# Patient Record
Sex: Male | Born: 1980 | Race: Black or African American | Hispanic: No | Marital: Single | Smoking: Current every day smoker
Health system: Southern US, Community
[De-identification: ages and names within clinical notes are randomized; demographics above are authoritative.]

## PROBLEM LIST (undated history)

## (undated) DIAGNOSIS — T790XXA Air embolism (traumatic), initial encounter: Secondary | ICD-10-CM

## (undated) HISTORY — PX: IVC FILTER PLACEMENT (ARMC HX): HXRAD1551

## (undated) HISTORY — PX: HIP SURGERY: SHX245

---

## 2006-04-20 ENCOUNTER — Emergency Department: Payer: Self-pay

## 2007-01-28 ENCOUNTER — Emergency Department (HOSPITAL_COMMUNITY): Admission: EM | Admit: 2007-01-28 | Discharge: 2007-01-28 | Payer: Self-pay | Admitting: Emergency Medicine

## 2009-11-14 ENCOUNTER — Emergency Department: Payer: Self-pay | Admitting: Emergency Medicine

## 2009-12-09 ENCOUNTER — Ambulatory Visit: Payer: Self-pay | Admitting: Cardiovascular Disease

## 2009-12-09 ENCOUNTER — Inpatient Hospital Stay: Payer: Self-pay | Admitting: Internal Medicine

## 2010-01-23 ENCOUNTER — Encounter: Payer: Self-pay | Admitting: Orthopedic Surgery

## 2010-02-15 ENCOUNTER — Encounter: Payer: Self-pay | Admitting: Orthopedic Surgery

## 2010-03-03 ENCOUNTER — Ambulatory Visit: Payer: Self-pay

## 2011-01-18 ENCOUNTER — Emergency Department: Payer: Self-pay | Admitting: Emergency Medicine

## 2011-05-01 ENCOUNTER — Emergency Department: Payer: Self-pay | Admitting: Emergency Medicine

## 2011-11-17 ENCOUNTER — Emergency Department: Payer: Self-pay | Admitting: Emergency Medicine

## 2011-11-19 ENCOUNTER — Emergency Department: Payer: Self-pay | Admitting: Emergency Medicine

## 2011-11-22 ENCOUNTER — Emergency Department: Payer: Self-pay | Admitting: Emergency Medicine

## 2011-11-22 LAB — CSF CELL COUNT WITH DIFFERENTIAL
CSF Tube #: 1
Eosinophil: 0 %
Lymphocytes: 0 %
Lymphocytes: 100 %
Monocytes/Macrophages: 0 %
Neutrophils: 0 %
Neutrophils: 0 %
Other Cells: 0 %
RBC (CSF): 29 /mm3
WBC (CSF): 0 /mm3
WBC (CSF): 3 /mm3

## 2011-11-22 LAB — CBC WITH DIFFERENTIAL/PLATELET
Basophil #: 0 10*3/uL (ref 0.0–0.1)
Basophil %: 0.2 %
Eosinophil #: 0.1 10*3/uL (ref 0.0–0.7)
HCT: 44.3 % (ref 40.0–52.0)
HGB: 15.7 g/dL (ref 13.0–18.0)
Lymphocyte #: 1.1 10*3/uL (ref 1.0–3.6)
Lymphocyte %: 5.8 %
MCH: 31.9 pg (ref 26.0–34.0)
MCHC: 35.4 g/dL (ref 32.0–36.0)
MCV: 90 fL (ref 80–100)
Monocyte #: 1.1 x10 3/mm — ABNORMAL HIGH (ref 0.2–1.0)
Neutrophil #: 16.2 10*3/uL — ABNORMAL HIGH (ref 1.4–6.5)
RDW: 12.9 % (ref 11.5–14.5)

## 2011-11-22 LAB — URINALYSIS, COMPLETE
Bacteria: NONE SEEN
Bilirubin,UR: NEGATIVE
Blood: NEGATIVE
Glucose,UR: NEGATIVE mg/dL (ref 0–75)
Ketone: NEGATIVE
Leukocyte Esterase: NEGATIVE
Nitrite: NEGATIVE
Ph: 7 (ref 4.5–8.0)
RBC,UR: 3 /HPF (ref 0–5)
Specific Gravity: 1.026 (ref 1.003–1.030)
Squamous Epithelial: <1
WBC UR: 3 /HPF (ref 0–5)

## 2011-11-22 LAB — BASIC METABOLIC PANEL
Calcium, Total: 9 mg/dL (ref 8.5–10.1)
Chloride: 105 mmol/L (ref 98–107)
Co2: 30 mmol/L (ref 21–32)
EGFR (Non-African Amer.): >60
Glucose: 96 mg/dL (ref 65–99)
Osmolality: 281 (ref 275–301)
Potassium: 4.5 mmol/L (ref 3.5–5.1)
Sodium: 141 mmol/L (ref 136–145)

## 2011-11-22 LAB — PROTEIN, CSF: Protein, CSF: 32 mg/dL (ref 15–45)

## 2011-11-22 LAB — GLUCOSE, CSF: Glucose, CSF: 63 mg/dL (ref 40–75)

## 2011-11-30 ENCOUNTER — Emergency Department: Payer: Self-pay | Admitting: Emergency Medicine

## 2011-11-30 LAB — CBC
HGB: 16.3 g/dL (ref 13.0–18.0)
Platelet: 225 10*3/uL (ref 150–440)
RBC: 5.22 10*6/uL (ref 4.40–5.90)
WBC: 9.2 10*3/uL (ref 3.8–10.6)

## 2011-11-30 LAB — COMPREHENSIVE METABOLIC PANEL
Albumin: 4.4 g/dL (ref 3.4–5.0)
BUN: 9 mg/dL (ref 7–18)
Co2: 28 mmol/L (ref 21–32)
Glucose: 107 mg/dL — ABNORMAL HIGH (ref 65–99)
SGOT(AST): 12 U/L — ABNORMAL LOW (ref 15–37)
SGPT (ALT): 21 U/L (ref 12–78)
Total Protein: 8 g/dL (ref 6.4–8.2)

## 2012-07-25 ENCOUNTER — Encounter: Payer: Self-pay | Admitting: Cardiothoracic Surgery

## 2012-07-25 ENCOUNTER — Encounter: Payer: Self-pay | Admitting: Nurse Practitioner

## 2012-08-23 ENCOUNTER — Emergency Department: Payer: Self-pay | Admitting: Emergency Medicine

## 2012-08-23 LAB — COMPREHENSIVE METABOLIC PANEL
Albumin: 4 g/dL (ref 3.4–5.0)
Alkaline Phosphatase: 81 U/L (ref 50–136)
Anion Gap: 5 — ABNORMAL LOW (ref 7–16)
Bilirubin,Total: 0.6 mg/dL (ref 0.2–1.0)
Calcium, Total: 9 mg/dL (ref 8.5–10.1)
Chloride: 109 mmol/L — ABNORMAL HIGH (ref 98–107)
Co2: 27 mmol/L (ref 21–32)
EGFR (African American): >60
EGFR (Non-African Amer.): >60
Potassium: 3.8 mmol/L (ref 3.5–5.1)
SGOT(AST): 22 U/L (ref 15–37)
SGPT (ALT): 18 U/L (ref 12–78)

## 2012-08-23 LAB — CBC
HGB: 15.1 g/dL (ref 13.0–18.0)
MCH: 30.9 pg (ref 26.0–34.0)
MCV: 90 fL (ref 80–100)
RBC: 4.89 10*6/uL (ref 4.40–5.90)
RDW: 13.1 % (ref 11.5–14.5)

## 2012-12-01 IMAGING — CT CT HEAD WITHOUT CONTRAST
2 series · 15 of 30 positions shown, 19 images · non-contrast
Comparison: none

REASON FOR EXAM: headache
COMMENTS:

[Series 2: without · axial · non-contrast · 0.42mm/px · z∈[+500,+620]mm · 13 of 30 slices shown, 17 images]
[im 3/30  brain]
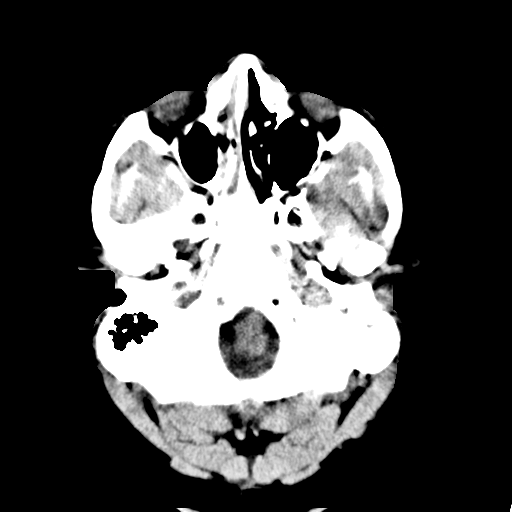
[im 3/30  bone]
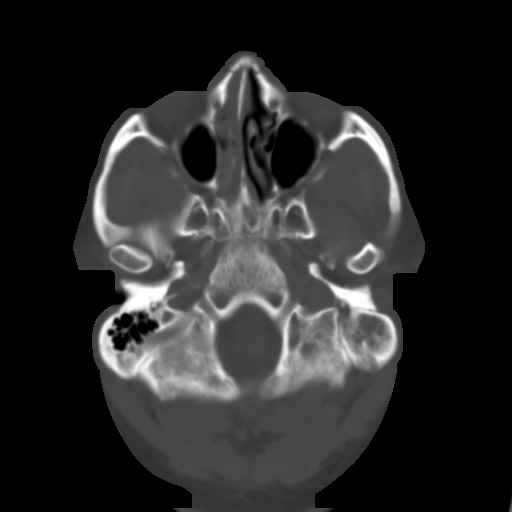
[im 5/30  brain]
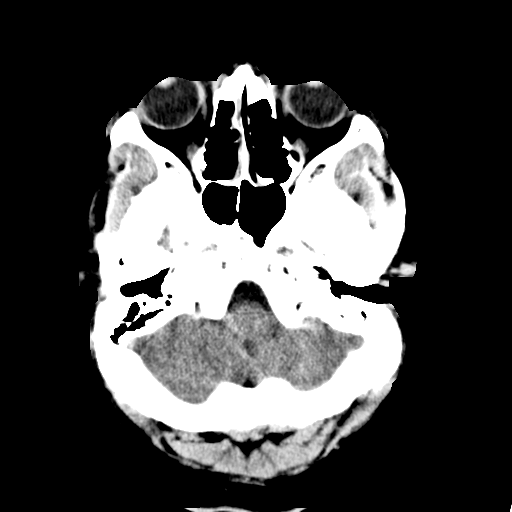
[im 7/30  brain]
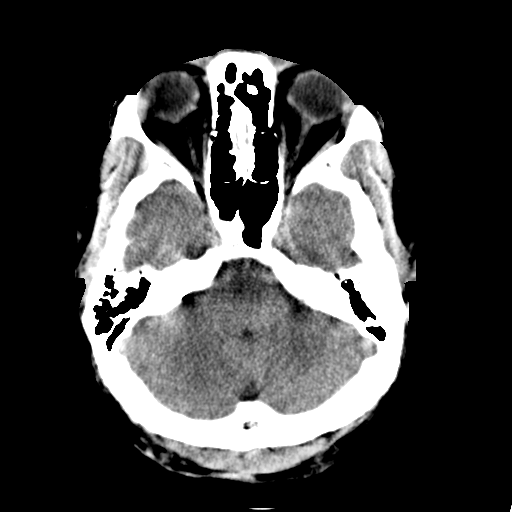
[im 9/30  brain]
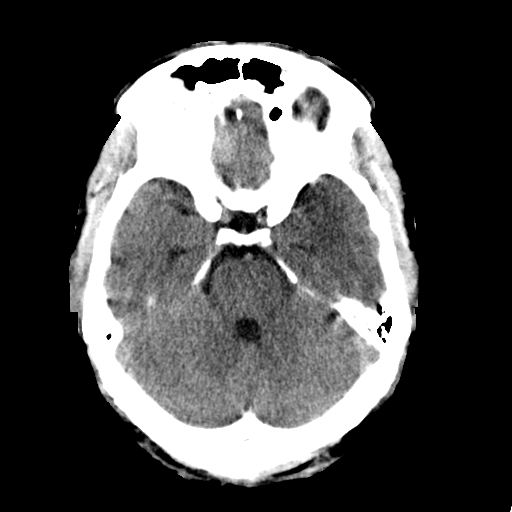
[im 11/30  brain]
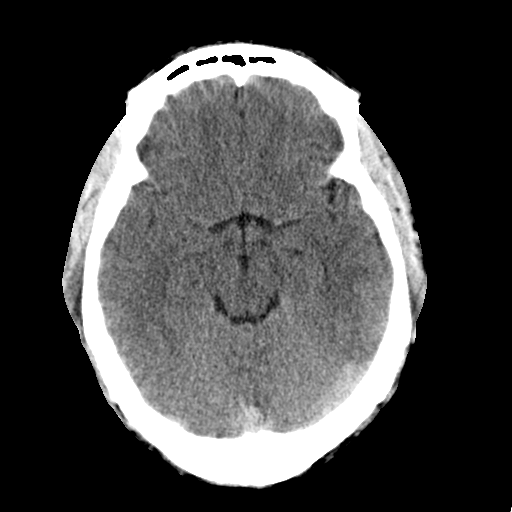
[im 11/30  bone]
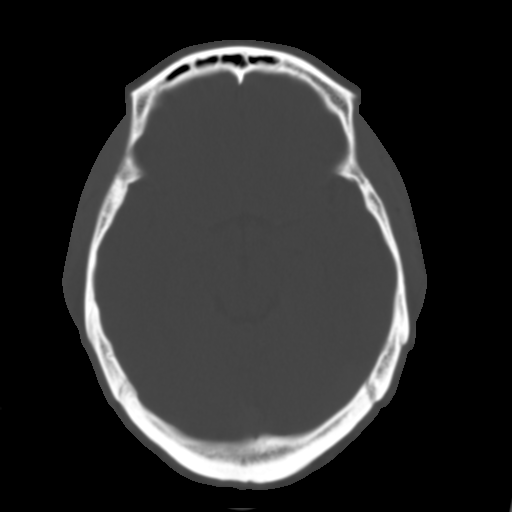
[im 13/30  brain]
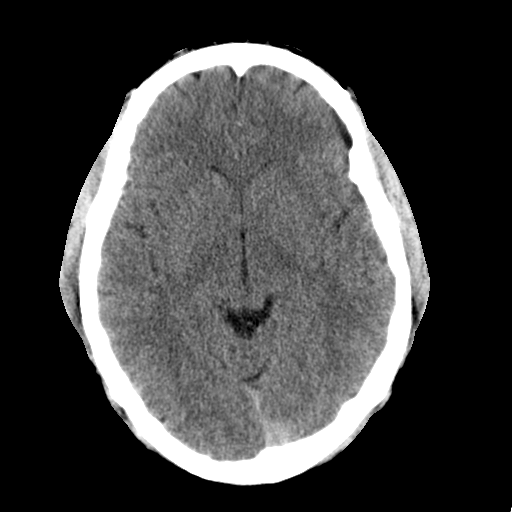
[im 15/30  brain]
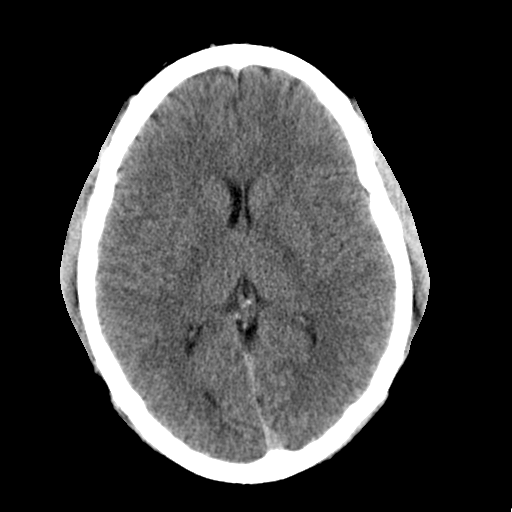
[im 17/30  brain]
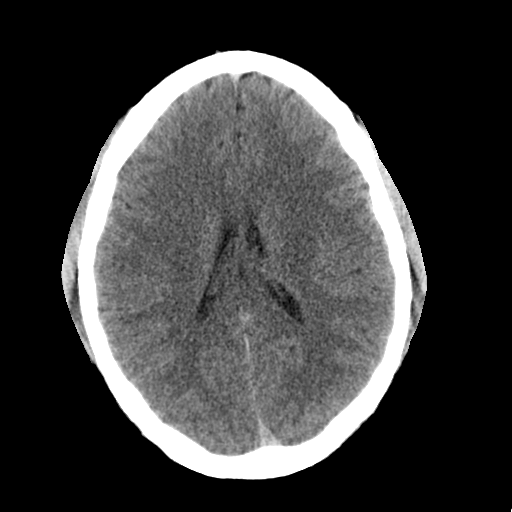
[im 19/30  brain]
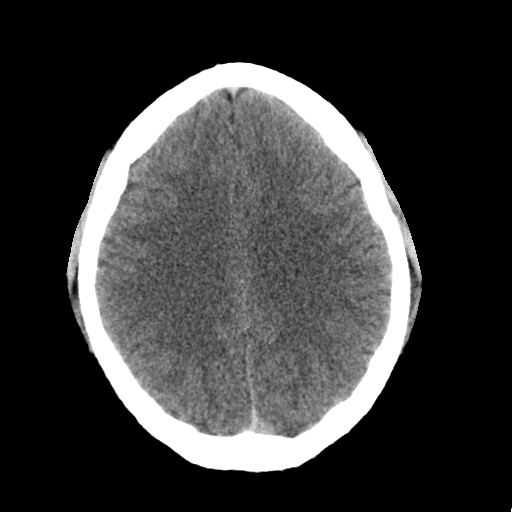
[im 19/30  bone]
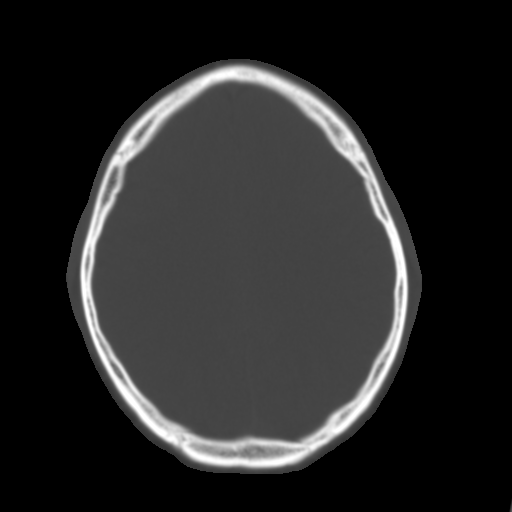
[im 21/30  brain]
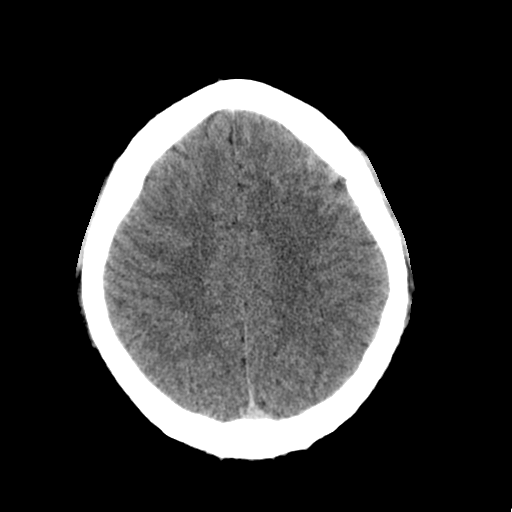
[im 23/30  brain]
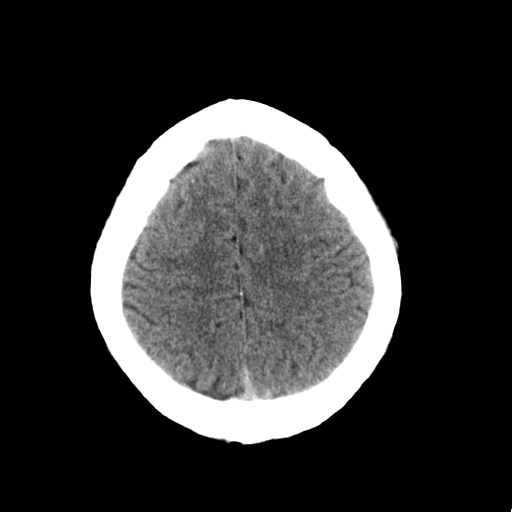
[im 25/30  brain]
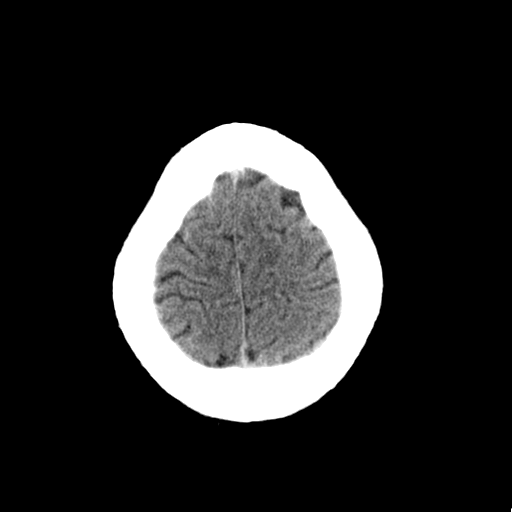
[im 27/30  brain]
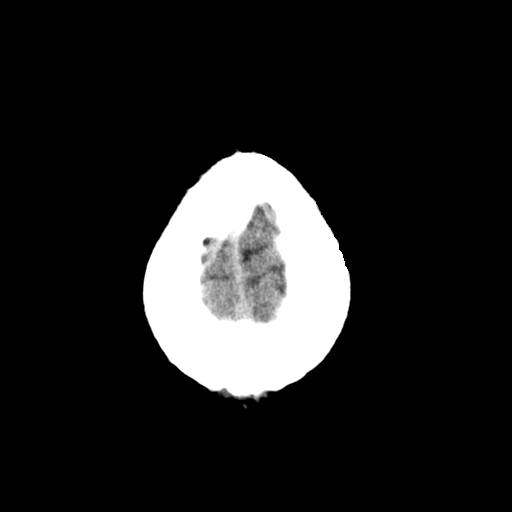
[im 27/30  bone]
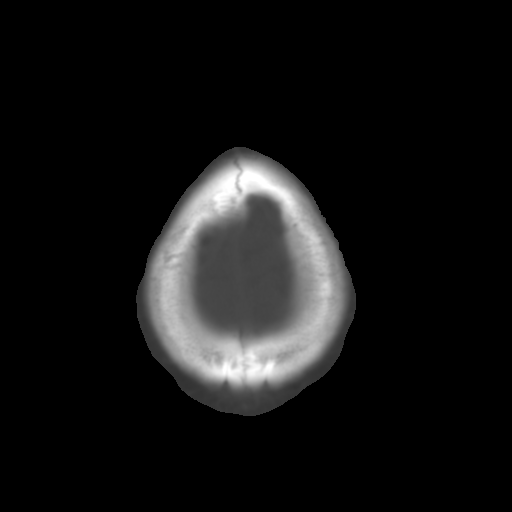

[Series 3: bone · axial · 0.42mm/px · z∈[+500,+520]mm · 2 of 30 slices shown]
[im 3/30  bone]
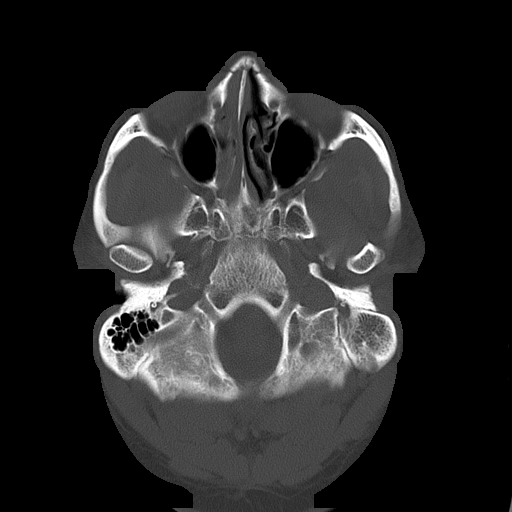
[im 7/30  bone]
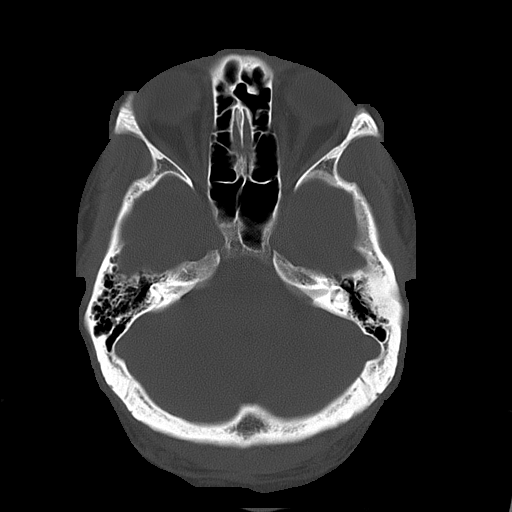

[15 of 30 positions shown; findings below may reference images not displayed]

PROCEDURE:     CT  - CT HEAD WITHOUT CONTRAST  - November 17, 2011  [DATE]

RESULT:     Axial noncontrast CT scanning was performed through the brain
with reconstructions at 5 mm intervals and slice thicknesses.

The ventricles are normal in size and position. There is no intracranial
hemorrhage nor intracranial mass effect. There is no evidence of an evolving
ischemic infarction. There are no abnormal intracranial calcifications. The
cerebellum and brainstem are normal in density.

At bone window settings the observed portions of the paranasal sinuses are
clear. The observed portions of the orbital soft tissues appear normal.
IMPRESSION: 1. There is no evidence of an acute ischemic or hemorrhagic infarction.
2. There is no intracranial mass effect nor hydrocephalus.
3. No acute abnormality of the of the observed portions of the paranasal
sinuses is seen.
4. The observed portions of the right globe and orbital soft tissues appear
normal.

[REDACTED]

## 2013-08-29 ENCOUNTER — Emergency Department: Payer: Self-pay | Admitting: Internal Medicine

## 2013-08-31 ENCOUNTER — Emergency Department: Payer: Self-pay | Admitting: Emergency Medicine

## 2013-08-31 LAB — CBC WITH DIFFERENTIAL/PLATELET
Basophil #: 0.1 10*3/uL (ref 0.0–0.1)
Basophil %: 0.5 %
EOS ABS: 0.1 10*3/uL (ref 0.0–0.7)
EOS PCT: 0.5 %
HCT: 47.2 % (ref 40.0–52.0)
HGB: 15.8 g/dL (ref 13.0–18.0)
Lymphocyte #: 1.9 10*3/uL (ref 1.0–3.6)
Lymphocyte %: 11.7 %
MCH: 30.6 pg (ref 26.0–34.0)
MCHC: 33.4 g/dL (ref 32.0–36.0)
MCV: 92 fL (ref 80–100)
MONOS PCT: 7.5 %
Monocyte #: 1.2 x10 3/mm — ABNORMAL HIGH (ref 0.2–1.0)
Neutrophil #: 12.8 10*3/uL — ABNORMAL HIGH (ref 1.4–6.5)
Neutrophil %: 79.8 %
Platelet: 213 10*3/uL (ref 150–440)
RBC: 5.15 10*6/uL (ref 4.40–5.90)
RDW: 12.9 % (ref 11.5–14.5)
WBC: 16 10*3/uL — ABNORMAL HIGH (ref 3.8–10.6)

## 2013-08-31 LAB — COMPREHENSIVE METABOLIC PANEL
ALBUMIN: 4.3 g/dL (ref 3.4–5.0)
ALK PHOS: 73 U/L
ANION GAP: 3 — AB (ref 7–16)
BUN: 10 mg/dL (ref 7–18)
Bilirubin,Total: 0.8 mg/dL (ref 0.2–1.0)
CHLORIDE: 109 mmol/L — AB (ref 98–107)
CO2: 27 mmol/L (ref 21–32)
Calcium, Total: 8.9 mg/dL (ref 8.5–10.1)
Creatinine: 1.19 mg/dL (ref 0.60–1.30)
EGFR (African American): >60
GLUCOSE: 101 mg/dL — AB (ref 65–99)
Osmolality: 277 (ref 275–301)
POTASSIUM: 3.8 mmol/L (ref 3.5–5.1)
SGOT(AST): 28 U/L (ref 15–37)
SGPT (ALT): 21 U/L (ref 12–78)
SODIUM: 139 mmol/L (ref 136–145)
TOTAL PROTEIN: 8.5 g/dL — AB (ref 6.4–8.2)

## 2013-08-31 LAB — LIPASE, BLOOD: Lipase: 155 U/L (ref 73–393)

## 2013-08-31 LAB — HEMOGLOBIN: HGB: 15.3 g/dL (ref 13.0–18.0)

## 2013-09-03 ENCOUNTER — Emergency Department: Payer: Self-pay | Admitting: Emergency Medicine

## 2013-09-07 ENCOUNTER — Emergency Department: Payer: Self-pay | Admitting: Emergency Medicine

## 2013-12-24 ENCOUNTER — Emergency Department: Payer: Self-pay | Admitting: Emergency Medicine

## 2015-01-09 ENCOUNTER — Emergency Department: Payer: Medicaid Other

## 2015-01-09 ENCOUNTER — Encounter: Payer: Self-pay | Admitting: Emergency Medicine

## 2015-01-09 ENCOUNTER — Emergency Department
Admission: EM | Admit: 2015-01-09 | Discharge: 2015-01-09 | Disposition: A | Discharge status: Home / Self Care | Payer: Medicaid Other | Attending: Student | Admitting: Student

## 2015-01-09 DIAGNOSIS — F329 Major depressive disorder, single episode, unspecified: Secondary | ICD-10-CM | POA: Diagnosis not present

## 2015-01-09 DIAGNOSIS — R000 Tachycardia, unspecified: Secondary | ICD-10-CM | POA: Insufficient documentation

## 2015-01-09 DIAGNOSIS — F1410 Cocaine abuse, uncomplicated: Secondary | ICD-10-CM | POA: Diagnosis present

## 2015-01-09 DIAGNOSIS — F1210 Cannabis abuse, uncomplicated: Secondary | ICD-10-CM | POA: Insufficient documentation

## 2015-01-09 DIAGNOSIS — F1110 Opioid abuse, uncomplicated: Secondary | ICD-10-CM | POA: Insufficient documentation

## 2015-01-09 DIAGNOSIS — F17200 Nicotine dependence, unspecified, uncomplicated: Secondary | ICD-10-CM | POA: Insufficient documentation

## 2015-01-09 HISTORY — DX: Air embolism (traumatic), initial encounter: T79.0XXA

## 2015-01-09 LAB — COMPREHENSIVE METABOLIC PANEL
ALBUMIN: 5.1 g/dL — AB (ref 3.5–5.0)
ALK PHOS: 56 U/L (ref 38–126)
ALT: 17 U/L (ref 17–63)
ANION GAP: 7 (ref 5–15)
AST: 19 U/L (ref 15–41)
BILIRUBIN TOTAL: 1.1 mg/dL (ref 0.3–1.2)
BUN: 11 mg/dL (ref 6–20)
CALCIUM: 9.3 mg/dL (ref 8.9–10.3)
CO2: 28 mmol/L (ref 22–32)
Chloride: 105 mmol/L (ref 101–111)
Creatinine, Ser: 1.15 mg/dL (ref 0.61–1.24)
GFR calc Af Amer: >60 mL/min (ref >60)
GFR calc non Af Amer: >60 mL/min (ref >60)
GLUCOSE: 101 mg/dL — AB (ref 65–99)
Potassium: 3.6 mmol/L (ref 3.5–5.1)
Sodium: 140 mmol/L (ref 135–145)
TOTAL PROTEIN: 8.6 g/dL — AB (ref 6.5–8.1)

## 2015-01-09 LAB — URINE DRUG SCREEN, QUALITATIVE (ARMC ONLY)
Amphetamines, Ur Screen: NOT DETECTED
BARBITURATES, UR SCREEN: NOT DETECTED
BENZODIAZEPINE, UR SCRN: NOT DETECTED
CANNABINOID 50 NG, UR ~~LOC~~: POSITIVE — AB
COCAINE METABOLITE, UR ~~LOC~~: POSITIVE — AB
MDMA (Ecstasy)Ur Screen: NOT DETECTED
METHADONE SCREEN, URINE: NOT DETECTED
OPIATE, UR SCREEN: POSITIVE — AB
Phencyclidine (PCP) Ur S: NOT DETECTED
Tricyclic, Ur Screen: NOT DETECTED

## 2015-01-09 LAB — CBC WITH DIFFERENTIAL/PLATELET
BASOS PCT: 1 %
Basophils Absolute: 0.1 10*3/uL (ref 0–0.1)
EOS ABS: 0.1 10*3/uL (ref 0–0.7)
Eosinophils Relative: 1 %
HCT: 49.5 % (ref 40.0–52.0)
HEMOGLOBIN: 16.5 g/dL (ref 13.0–18.0)
Lymphocytes Relative: 14 %
Lymphs Abs: 1.5 10*3/uL (ref 1.0–3.6)
MCH: 30 pg (ref 26.0–34.0)
MCHC: 33.4 g/dL (ref 32.0–36.0)
MCV: 90 fL (ref 80.0–100.0)
MONO ABS: 0.7 10*3/uL (ref 0.2–1.0)
MONOS PCT: 7 %
NEUTROS PCT: 77 %
Neutro Abs: 8.2 10*3/uL — ABNORMAL HIGH (ref 1.4–6.5)
PLATELETS: 268 10*3/uL (ref 150–440)
RBC: 5.5 MIL/uL (ref 4.40–5.90)
RDW: 12.8 % (ref 11.5–14.5)
WBC: 10.6 10*3/uL (ref 3.8–10.6)

## 2015-01-09 LAB — ETHANOL: Alcohol, Ethyl (B): <5 mg/dL (ref <5)

## 2015-01-09 LAB — ACETAMINOPHEN LEVEL

## 2015-01-09 LAB — SALICYLATE LEVEL

## 2015-01-09 MED ORDER — LORAZEPAM 2 MG/ML IJ SOLN
2.0000 mg | Freq: Once | INTRAMUSCULAR | Status: AC
  Administered 2015-01-09: 2 mg via INTRAVENOUS
  Filled 2015-01-09: qty 1

## 2015-01-09 MED ORDER — SODIUM CHLORIDE 0.9 % IV BOLUS (SEPSIS)
1000.0000 mL | Freq: Once | INTRAVENOUS | Status: AC
  Administered 2015-01-09: 1000 mL via INTRAVENOUS

## 2015-01-09 MED ORDER — LORAZEPAM 2 MG/ML IJ SOLN
INTRAMUSCULAR | Status: AC
  Administered 2015-01-09: 1 mg via INTRAVENOUS
  Filled 2015-01-09: qty 1

## 2015-01-09 MED ORDER — LORAZEPAM 2 MG/ML IJ SOLN
1.0000 mg | Freq: Once | INTRAMUSCULAR | Status: AC
  Administered 2015-01-09: 1 mg via INTRAVENOUS

## 2015-01-09 NOTE — ED Notes (Signed)
Pt discharged to [facility] after verbalizing understanding of discharge instructions; nad noted.

## 2015-01-09 NOTE — BH Assessment (Signed)
Per Request of ER MD (Dr. Inocencio HomesGayle), patient information forwarded to [facility] ([phone]). Obtained verbal consent from patient to send the information to [facility]. They will be able to pick the patient up at 2:15pm. Writer updated ER MD (Dr. Inocencio HomesGayle) and patient's RN Corrie Dandy(Mary).

## 2015-01-09 NOTE — ED Notes (Signed)
Pt presents after taking 1/2 gram cocaine this morning. He is tearful and sad, stating that he doesn't feel like anyone cares about him and that he "thought I would be farther along in life than I am." Pt wants to go to [facility] for detox and needs clearance. Pt tachycardiac and hypertensive at assessment.

## 2015-01-09 NOTE — ED Provider Notes (Signed)
[facility] Emergency Department Provider Note  ____________________________________________  Time seen: Approximately 10:24 AM  I have reviewed the triage vital signs and the nursing notes.   HISTORY  Chief Complaint detox     HPI Joe Ramirez is a 34 y.o. male with history of pulmonary embolism status post IVC filter, history of polysubstance abuse who presents for evaluation due to desire for detox off of cocaine. Patient reports that he has been abusing cocaine for several years, last used a gram of cocaine this morning. He also smokes marijuana. His addiction has been ongoing and he has not been able to help himself. He found a phone number, called [facility] and they told him to go to the emergency department for evaluation. He denies any suicidal ideation, homicidal ideation or audiovisual hallucinations. No nausea, vomiting, diarrhea, fevers or chills. No chest pain or difficulty breathing. He has had cough sneezing and runny nose but no fevers. He has no symptoms at this time but states he feels "sad". He has been mildly depressed, there are no modifying factors.   Past Medical History  Diagnosis Date  . Pulmonary air embolism (HCC)     There are no active problems to display for this patient.   History reviewed. No pertinent past surgical history.  No current outpatient prescriptions on file.  Allergies Review of patient's allergies indicates no known allergies.  No family history on file.  Social History Social History  Substance Use Topics  . Smoking status: Current Every Day Smoker  . Smokeless tobacco: None  . Alcohol Use: No    Review of Systems Constitutional: No fever/chills Eyes: No visual changes. ENT: No sore throat. Cardiovascular: Denies chest pain. Respiratory: Denies shortness of breath. Gastrointestinal: No abdominal pain.  No nausea, no vomiting.  No diarrhea.  No constipation. Genitourinary: Negative for  dysuria. Musculoskeletal: Negative for back pain. Skin: Negative for rash. Neurological: Negative for headaches, focal weakness or numbness.  10-point ROS otherwise negative.  ____________________________________________   PHYSICAL EXAM:  VITAL SIGNS: ED Triage Vitals  Enc Vitals Group     BP 01/09/15 1021 171/90 mmHg     Pulse Rate 01/09/15 1021 122     Resp 01/09/15 1021 18     Temp 01/09/15 1021 98.8 F (37.1 C)     Temp Source 01/09/15 1021 Oral     SpO2 01/09/15 1021 98 %     Weight --      Height --      Head Cir --      Peak Flow --      Pain Score --      Pain Loc --      Pain Edu? --      Excl. in GC? --     Constitutional: Alert and oriented. Well appearing and in no acute distress. Tearful at times. Eyes: Conjunctivae are normal. PERRL. EOMI. Head: Atraumatic. Nose: No congestion/rhinnorhea. Mouth/Throat: Mucous membranes are moist.  Oropharynx non-erythematous. Neck: No stridor. Cardiovascular: mildly tachycardic rate, regular rhythm. Grossly normal heart sounds.  Good peripheral circulation. Respiratory: Normal respiratory effort.  No retractions. Lungs CTAB. Gastrointestinal: Soft and nontender. No distention.  No CVA tenderness. Genitourinary: deferred Musculoskeletal: No lower extremity tenderness nor edema.  No joint effusions. Neurologic:  Normal speech and language. No gross focal neurologic deficits are appreciated. No gait instability. Skin:  Skin is warm, dry and intact. No rash noted. Psychiatric: Mood is slightly depressed but affect is normal. Speech and behavior are  normal.  ____________________________________________   LABS (all labs ordered are listed, but only abnormal results are displayed)  Labs Reviewed  COMPREHENSIVE METABOLIC PANEL - Abnormal; Notable for the following:    Glucose, Bld 101 (*)    Total Protein 8.6 (*)    Albumin 5.1 (*)    All other components within normal limits  CBC WITH DIFFERENTIAL/PLATELET - Abnormal;  Notable for the following:    Neutro Abs 8.2 (*)    All other components within normal limits  ACETAMINOPHEN LEVEL - Abnormal; Notable for the following:    Acetaminophen (Tylenol), Serum <10 (*)    All other components within normal limits  URINE DRUG SCREEN, QUALITATIVE ([facility] ONLY) - Abnormal; Notable for the following:    Cocaine Metabolite,Ur [place] POSITIVE (*)    Opiate, Ur Screen POSITIVE (*)    Cannabinoid 50 Ng, Ur [place] POSITIVE (*)    All other components within normal limits  ETHANOL  SALICYLATE LEVEL   ____________________________________________  EKG  ED ECG REPORT I, Gayla DossGayle, Khali Albanese A, the attending physician, personally viewed and interpreted this ECG.   Date: 01/09/2015  EKG Time: 11:31  Rate: 110  Rhythm: sinus tachycardia  Axis: normal  Intervals:none  ST&T Change: No acute ST elevation. Nonspecific T-wave abnormality in to 3, aVF, V4, V5, V6 which is unchanged from 11/22/11.  ____________________________________________  RADIOLOGY  CXR IMPRESSION: Stable chest x-ray. No evidence of acute cardiopulmonary abnormality. No evidence of pneumonia.  ____________________________________________   PROCEDURES  Procedure(s) performed: None  Critical Care performed: No  ____________________________________________   INITIAL IMPRESSION / ASSESSMENT AND PLAN / ED COURSE  Pertinent labs & imaging results that were available during my care of the patient were reviewed by me and considered in my medical decision making (see chart for details).  Joe Ramirez is a 34 y.o. male with history of pulmonary embolism status post IVC filter, history of polysubstance abuse who presents for evaluation due to desire for detox off of cocaine. On exam, he is very well-appearing and in no acute distress. He is mildly tachycardic and moderately hypertensive and I suspect this is secondary to his recent cocaine use which occurred just a few hours ago. Additionally, he is tearful and  somewhat emotional at times which is likely contributing to his tachycardia. We'll give IV fluids and Ativan. He denies any chest pain or difficulty breathing however given his cough, we'll obtain screening chest x-ray to value for any evidence of pneumonia. He has no suicidal ideation, homicidal ideation or audiovisual hallucinations, no indication for commitment. Will obtain screening labs and discussed with behavioral health for possible detox placement.   ----------------------------------------- 2:01 PM on 01/09/2015 ----------------------------------------- Labs reviewed. Normal CBC, CMP, negative ethanol, salicylate and acetaminophen levels. Urine drug screen positive for cannabis, opioids, cocaine. [facility] has arrived and will take the patient for treatment/detox. Patient Still mildly tachycardic and hypertensive and I again suspect this is secondary to cocaine use today. Chest x-ray clear. He is complaining no chest pain or difficulty breathing and I doubt ACS. EKG is consistent with acute ischemia and is unchanged from prior. He has an IVC filter in place making recurrent PE unlikely. We'll discharge into the custody of [facility]. The patient is comfortable with this plan. ____________________________________________   FINAL CLINICAL IMPRESSION(S) / ED DIAGNOSES  Final diagnoses:  Cocaine abuse      Gayla DossEryka A Issaih Kaus, MD 01/09/15 [phone]

## 2015-01-09 NOTE — ED Notes (Addendum)
Pt to ED for detox from cocaine, sent here from [facility], last used cocaine this am

## 2015-07-23 ENCOUNTER — Emergency Department
Admission: EM | Admit: 2015-07-23 | Discharge: 2015-07-23 | Disposition: A | Discharge status: Home / Self Care | Payer: Medicaid Other | Attending: Emergency Medicine | Admitting: Emergency Medicine

## 2015-07-23 DIAGNOSIS — Y999 Unspecified external cause status: Secondary | ICD-10-CM | POA: Insufficient documentation

## 2015-07-23 DIAGNOSIS — X58XXXA Exposure to other specified factors, initial encounter: Secondary | ICD-10-CM | POA: Diagnosis not present

## 2015-07-23 DIAGNOSIS — T161XXA Foreign body in right ear, initial encounter: Secondary | ICD-10-CM | POA: Diagnosis present

## 2015-07-23 DIAGNOSIS — Y939 Activity, unspecified: Secondary | ICD-10-CM | POA: Insufficient documentation

## 2015-07-23 DIAGNOSIS — F1490 Cocaine use, unspecified, uncomplicated: Secondary | ICD-10-CM | POA: Diagnosis not present

## 2015-07-23 DIAGNOSIS — F17200 Nicotine dependence, unspecified, uncomplicated: Secondary | ICD-10-CM | POA: Insufficient documentation

## 2015-07-23 DIAGNOSIS — Z8679 Personal history of other diseases of the circulatory system: Secondary | ICD-10-CM | POA: Insufficient documentation

## 2015-07-23 DIAGNOSIS — Y929 Unspecified place or not applicable: Secondary | ICD-10-CM | POA: Diagnosis not present

## 2015-07-23 MED ORDER — LIDOCAINE HCL (PF) 1 % IJ SOLN
5.0000 mL | Freq: Once | INTRAMUSCULAR | Status: AC
  Administered 2015-07-23: 5 mL

## 2015-07-23 NOTE — ED Notes (Signed)
Pt to triage room stating "bug moving in ear"; 1% xylocaine admin in right ear canal with relief of sensation; Dr Manson PasseyBrown to triage and small moth removed

## 2015-07-23 NOTE — ED Notes (Signed)
Pt states something flew into his right ear, can hear it flapping. Pt alert and oriented X4, active, cooperative, pt in NAD. RR even and unlabored, color WNL.

## 2015-07-23 NOTE — Discharge Instructions (Signed)
Ear Foreign Body °An ear foreign body is an object that is stuck in your ear. The object is usually stuck in the ear canal. °CAUSES °In all ages of people, the most common foreign bodies are insects that enter the ear canal. It is common for young children to put objects into the ear canal. These may include pebbles, beads, parts of toys, and any other small objects that fit into the ear. In adults, objects such as cotton swabs may become lodged in the ear canal.  °SIGNS AND SYMPTOMS °A foreign body in the ear may cause: °· Pain. °· Buzzing or roaring sounds. °· Hearing loss. °· Ear drainage or bleeding. °· Nausea and vomiting. °· A feeling that your ear is full. °DIAGNOSIS °Your health care provider may be able to diagnose an ear foreign body based on the information that you provide, your symptoms, and a physical exam. Your health care provider may also perform tests, such as testing your hearing and your ear pressure, to check for infection or other problems that are caused by the foreign body in your ear. °TREATMENT °Treatment depends on what the foreign body is, the location of the foreign body in your ear, and whether or not the foreign body has injured any part of your inner ear. If the foreign body is visible to your health care provider, it may be possible to remove the foreign body using: °· A tool, such as medical tweezers (forceps) or a suction tube (catheter). °· Irrigation. This uses water to flush the foreign body out of your ear. This is used only if the foreign body is not likely to swell or enlarge when it is put in water. °If the foreign body is not visible or your health care provider was not able to remove the foreign body, you may be referred to a specialist for removal. You may also be prescribed antibiotic medicine or ear drops to prevent infection. If the foreign body has caused injury to other parts of your ear, you may need additional treatment. °HOME CARE INSTRUCTIONS °· Keep all  follow-up visits as directed by your health care provider. This is important. °· Take medicines only as directed by your health care provider. °· If you were prescribed an antibiotic medicine, finish it all even if you start to feel better. °PREVENTION °· Keep small objects out of reach of young children. Tell children not to put anything in their ears. °· Do not put anything in your ear, including cotton swabs, to clean your ears. Talk to your health care provider about how to clean your ears safely. °SEEK MEDICAL CARE IF: °· You have a headache. °· Your have blood coming from your ear. °· You have a fever. °· You have increased pain or swelling of your ear. °· Your hearing is reduced. °· You have discharge coming from your ear. °  °This information is not intended to replace advice given to you by your health care provider. Make sure you discuss any questions you have with your health care provider. °  °Document Released: 01/30/2000 Document Revised: 02/22/2014 Document Reviewed: 09/17/2013 °Elsevier Interactive Patient Education ©2016 [employer] ° °

## 2015-07-23 NOTE — ED Provider Notes (Signed)
[facility] Emergency Department Provider Note  ____________________________________________  Time seen: 11:20 PM  I have reviewed the triage vital signs and the nursing notes.   HISTORY  Chief Complaint Foreign Body in Ear      HPI Joe Ramirez is a 35 y.o. male presents with "bug flew in my right ear". Patient states that he feels insect still moving around in his right ear.     Past Medical History  Diagnosis Date  . Pulmonary air embolism (HCC)     There are no active problems to display for this patient.  Past surgical history None No current outpatient prescriptions on file.  Allergies No known drug allergies No family history on file.  Social History Social History  Substance Use Topics  . Smoking status: Current Every Day Smoker  . Smokeless tobacco: None  . Alcohol Use: No    Review of Systems  Constitutional: Negative for fever. Eyes: Negative for visual changes. ENT: Negative for sore throat.Positive for insect in right ear Cardiovascular: Negative for chest pain. Respiratory: Negative for shortness of breath. Gastrointestinal: Negative for abdominal pain, vomiting and diarrhea. Genitourinary: Negative for dysuria. Musculoskeletal: Negative for back pain. Skin: Negative for rash. Neurological: Negative for headaches, focal weakness or numbness.   10-point ROS otherwise negative.  ____________________________________________   PHYSICAL EXAM:  VITAL SIGNS: ED Triage Vitals  Enc Vitals Group     BP 07/23/15 2217 150/89 mmHg     Pulse Rate 07/23/15 2215 101     Resp 07/23/15 2215 18     Temp 07/23/15 2215 98.3 F (36.8 C)     Temp Source 07/23/15 2215 Oral     SpO2 07/23/15 2215 98 %     Weight 07/23/15 2215 220 lb (99.791 kg)     Height 07/23/15 2215 5\' 6"  (1.676 m)     Head Cir --      Peak Flow --      Pain Score --      Pain Loc --      Pain Edu? --      Excl. in GC? --     Constitutional:  Alert and oriented. Well appearing and in no distress. Eyes: Conjunctivae are normal. PERRL. Normal extraocular movements. ENT   Head: Normocephalic and atraumatic.   Nose: No congestion/rhinnorhea.   Mouth/Throat: Mucous membranes are moist.   Neck: No stridor. Ear: Insect noted inside the patient's right external auditory canal Hematological/Lymphatic/Immunilogical: No cervical lymphadenopathy. Cardiovascular: Normal rate, regular rhythm. Normal and symmetric distal pulses are present in all extremities. No murmurs, rubs, or gallops. Respiratory: Normal respiratory effort without tachypnea nor retractions. Breath sounds are clear and equal bilaterally. No wheezes/rales/rhonchi. Neurologic:  Normal speech and language. No gross focal neurologic deficits are appreciated. Speech is normal.  Skin:  Skin is warm, dry and intact. No rash noted.     INITIAL IMPRESSION / ASSESSMENT AND PLAN / ED COURSE  Pertinent labs & imaging results that were available during my care of the patient were reviewed by me and considered in my medical decision making (see chart for details).  Xylocaine injuries in the patient's right EAC. Insect removed by suction from the right external auditory canal. Inspection of the EAC and tympanic membrane revealed no gross abnormality status post insect removal.  ____________________________________________   FINAL CLINICAL IMPRESSION(S) / ED DIAGNOSES  Final diagnoses:  Ear foreign body, right, initial encounter      Darci Currentandolph N Brown, MD 07/23/15 2338

## 2015-09-14 ENCOUNTER — Emergency Department: Payer: Medicaid Other

## 2015-09-14 ENCOUNTER — Emergency Department
Admission: EM | Admit: 2015-09-14 | Discharge: 2015-09-14 | Disposition: A | Discharge status: Home / Self Care | Payer: Medicaid Other | Attending: Emergency Medicine | Admitting: Emergency Medicine

## 2015-09-14 ENCOUNTER — Encounter: Payer: Self-pay | Admitting: Emergency Medicine

## 2015-09-14 DIAGNOSIS — F17200 Nicotine dependence, unspecified, uncomplicated: Secondary | ICD-10-CM | POA: Insufficient documentation

## 2015-09-14 DIAGNOSIS — J029 Acute pharyngitis, unspecified: Secondary | ICD-10-CM | POA: Diagnosis present

## 2015-09-14 DIAGNOSIS — J069 Acute upper respiratory infection, unspecified: Secondary | ICD-10-CM | POA: Insufficient documentation

## 2015-09-14 MED ORDER — LIDOCAINE VISCOUS 2 % MT SOLN: 5.0000 mL | Freq: Four times a day (QID) | OROMUCOSAL | 0 refills | Status: DC | PRN

## 2015-09-14 MED ORDER — PSEUDOEPH-BROMPHEN-DM 30-2-10 MG/5ML PO SYRP: 5.0000 mL | ORAL_SOLUTION | Freq: Four times a day (QID) | ORAL | 0 refills | Status: DC | PRN

## 2015-09-14 MED ORDER — LIDOCAINE VISCOUS 2 % MT SOLN
15.0000 mL | Freq: Once | OROMUCOSAL | Status: AC
  Administered 2015-09-14: 15 mL via OROMUCOSAL
  Filled 2015-09-14: qty 15

## 2015-09-14 MED ORDER — DIPHENHYDRAMINE HCL 12.5 MG/5ML PO ELIX
25.0000 mg | ORAL_SOLUTION | Freq: Once | ORAL | Status: AC
  Administered 2015-09-14: 25 mg via ORAL
  Filled 2015-09-14: qty 10

## 2015-09-14 NOTE — ED Notes (Signed)
Pt states that his friend started having a sore throat for the past week and a half, she is being evaluated next door, pt states that he wants to catch any symptoms before they get too bad, pt states he is having some pain with swallowing, no fever, cont to monitor
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
## 2015-09-14 NOTE — ED Triage Notes (Signed)
Patient arrives with girlfriend, as she was being triaged patient stated "i started having a scratchy feeling in my throat and I need to know if Im dying too"  Patient is otherwise completely asymptomatic

## 2015-10-24 ENCOUNTER — Emergency Department
Admission: EM | Admit: 2015-10-24 | Discharge: 2015-10-25 | Disposition: A | Discharge status: Home / Self Care | Payer: Medicaid Other | Attending: Emergency Medicine | Admitting: Emergency Medicine

## 2015-10-24 ENCOUNTER — Emergency Department: Payer: Medicaid Other

## 2015-10-24 ENCOUNTER — Encounter: Payer: Self-pay | Admitting: Emergency Medicine

## 2015-10-24 DIAGNOSIS — J209 Acute bronchitis, unspecified: Secondary | ICD-10-CM

## 2015-10-24 DIAGNOSIS — F17200 Nicotine dependence, unspecified, uncomplicated: Secondary | ICD-10-CM | POA: Diagnosis not present

## 2015-10-24 DIAGNOSIS — R05 Cough: Secondary | ICD-10-CM | POA: Diagnosis present

## 2015-10-24 LAB — CBC
HCT: 45.2 % (ref 40.0–52.0)
Hemoglobin: 15.6 g/dL (ref 13.0–18.0)
MCH: 30.7 pg (ref 26.0–34.0)
MCHC: 34.5 g/dL (ref 32.0–36.0)
MCV: 89 fL (ref 80.0–100.0)
PLATELETS: 231 10*3/uL (ref 150–440)
RBC: 5.08 MIL/uL (ref 4.40–5.90)
RDW: 12.8 % (ref 11.5–14.5)
WBC: 11.9 10*3/uL — AB (ref 3.8–10.6)

## 2015-10-24 LAB — BASIC METABOLIC PANEL
Anion gap: 10 (ref 5–15)
BUN: 10 mg/dL (ref 6–20)
CO2: 23 mmol/L (ref 22–32)
CREATININE: 0.97 mg/dL (ref 0.61–1.24)
Calcium: 9.1 mg/dL (ref 8.9–10.3)
Chloride: 109 mmol/L (ref 101–111)
GFR calc Af Amer: >60 mL/min (ref >60)
GLUCOSE: 98 mg/dL (ref 65–99)
Potassium: 3.7 mmol/L (ref 3.5–5.1)
SODIUM: 142 mmol/L (ref 135–145)

## 2015-10-24 LAB — TROPONIN I: Troponin I: <0.03 ng/mL (ref <0.03)

## 2015-10-24 MED ORDER — HYDROCOD POLST-CPM POLST ER 10-8 MG/5ML PO SUER
5.0000 mL | Freq: Once | ORAL | Status: AC
  Administered 2015-10-24: 5 mL via ORAL
  Filled 2015-10-24: qty 5

## 2015-10-24 MED ORDER — ALBUTEROL SULFATE (2.5 MG/3ML) 0.083% IN NEBU
5.0000 mg | INHALATION_SOLUTION | Freq: Once | RESPIRATORY_TRACT | Status: AC
  Administered 2015-10-24: 5 mg via RESPIRATORY_TRACT
  Filled 2015-10-24: qty 6

## 2015-10-24 MED ORDER — AZITHROMYCIN 500 MG PO TABS
500.0000 mg | ORAL_TABLET | Freq: Once | ORAL | Status: AC
  Administered 2015-10-24: 500 mg via ORAL
  Filled 2015-10-24: qty 1

## 2015-10-24 NOTE — ED Triage Notes (Signed)
Pt. States cough and difficultly breathing that started last night and became worse today.  Pt. States productive cough.
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
## 2015-10-25 LAB — FIBRIN DERIVATIVES D-DIMER (ARMC ONLY): Fibrin derivatives D-dimer (ARMC): 127 (ref 0–499)

## 2015-10-25 MED ORDER — BENZONATATE 100 MG PO CAPS: 100.0000 mg | ORAL_CAPSULE | Freq: Four times a day (QID) | ORAL | 0 refills | Status: AC | PRN

## 2015-10-25 MED ORDER — SODIUM CHLORIDE 0.9 % IV BOLUS (SEPSIS)
1000.0000 mL | Freq: Once | INTRAVENOUS | Status: AC
  Administered 2015-10-25: 1000 mL via INTRAVENOUS

## 2015-10-25 MED ORDER — AZITHROMYCIN 500 MG PO TABS: 500.0000 mg | ORAL_TABLET | Freq: Every day | ORAL | 0 refills | Status: AC

## 2015-11-20 ENCOUNTER — Encounter: Payer: Self-pay | Admitting: Emergency Medicine

## 2015-11-20 ENCOUNTER — Emergency Department: Payer: Medicaid Other

## 2015-11-20 ENCOUNTER — Emergency Department
Admission: EM | Admit: 2015-11-20 | Discharge: 2015-11-20 | Discharge status: Against Medical Advice | Payer: Medicaid Other | Attending: Emergency Medicine | Admitting: Emergency Medicine

## 2015-11-20 DIAGNOSIS — R079 Chest pain, unspecified: Secondary | ICD-10-CM

## 2015-11-20 DIAGNOSIS — R42 Dizziness and giddiness: Secondary | ICD-10-CM | POA: Diagnosis not present

## 2015-11-20 DIAGNOSIS — R030 Elevated blood-pressure reading, without diagnosis of hypertension: Secondary | ICD-10-CM | POA: Diagnosis not present

## 2015-11-20 DIAGNOSIS — R200 Anesthesia of skin: Secondary | ICD-10-CM | POA: Diagnosis present

## 2015-11-20 DIAGNOSIS — I998 Other disorder of circulatory system: Secondary | ICD-10-CM

## 2015-11-20 DIAGNOSIS — R0602 Shortness of breath: Secondary | ICD-10-CM | POA: Diagnosis not present

## 2015-11-20 DIAGNOSIS — F17200 Nicotine dependence, unspecified, uncomplicated: Secondary | ICD-10-CM | POA: Insufficient documentation

## 2015-11-20 LAB — URINALYSIS COMPLETE WITH MICROSCOPIC (ARMC ONLY)
BACTERIA UA: NONE SEEN
Bilirubin Urine: NEGATIVE
GLUCOSE, UA: NEGATIVE mg/dL
Hgb urine dipstick: NEGATIVE
Ketones, ur: NEGATIVE mg/dL
LEUKOCYTES UA: NEGATIVE
Nitrite: NEGATIVE
Protein, ur: 30 mg/dL — AB
SPECIFIC GRAVITY, URINE: 1.028 (ref 1.005–1.030)
Squamous Epithelial / LPF: NONE SEEN
pH: 7 (ref 5.0–8.0)

## 2015-11-20 LAB — TROPONIN I

## 2015-11-20 LAB — BASIC METABOLIC PANEL
ANION GAP: 5 (ref 5–15)
BUN: 13 mg/dL (ref 6–20)
CHLORIDE: 109 mmol/L (ref 101–111)
CO2: 26 mmol/L (ref 22–32)
CREATININE: 1.11 mg/dL (ref 0.61–1.24)
Calcium: 9.1 mg/dL (ref 8.9–10.3)
GFR calc non Af Amer: >60 mL/min (ref >60)
Glucose, Bld: 101 mg/dL — ABNORMAL HIGH (ref 65–99)
Potassium: 3.9 mmol/L (ref 3.5–5.1)
SODIUM: 140 mmol/L (ref 135–145)

## 2015-11-20 LAB — CBC
HCT: 44 % (ref 40.0–52.0)
HEMOGLOBIN: 15.3 g/dL (ref 13.0–18.0)
MCH: 31 pg (ref 26.0–34.0)
MCHC: 34.8 g/dL (ref 32.0–36.0)
MCV: 89.1 fL (ref 80.0–100.0)
PLATELETS: 219 10*3/uL (ref 150–440)
RBC: 4.94 MIL/uL (ref 4.40–5.90)
RDW: 13 % (ref 11.5–14.5)
WBC: 5.8 10*3/uL (ref 3.8–10.6)

## 2015-11-20 MED ORDER — METOCLOPRAMIDE HCL 5 MG/ML IJ SOLN
10.0000 mg | Freq: Once | INTRAMUSCULAR | Status: AC
  Administered 2015-11-20: 10 mg via INTRAVENOUS
  Filled 2015-11-20: qty 2

## 2015-11-20 MED ORDER — SODIUM CHLORIDE 0.9 % IV BOLUS (SEPSIS)
500.0000 mL | Freq: Once | INTRAVENOUS | Status: AC
  Administered 2015-11-20: 500 mL via INTRAVENOUS

## 2015-11-20 MED ORDER — IOPAMIDOL (ISOVUE-370) INJECTION 76%
100.0000 mL | Freq: Once | INTRAVENOUS | Status: AC | PRN
  Administered 2015-11-20: 100 mL via INTRAVENOUS

## 2015-11-20 NOTE — ED Triage Notes (Addendum)
Pt to ed with c/o elevated blood pressure, and headache and dizziness. Pt states his hands have recently been swelling so he checked his blood pressure and was concerned.  Pt denies chest pain.  Pt does not take BP meds.
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
## 2015-11-20 NOTE — ED Notes (Signed)
Pt in bed eating bag of chips.

## 2015-11-20 NOTE — ED Notes (Signed)
Patient transported to CT 

## 2015-11-20 NOTE — Discharge Instructions (Signed)
Follow-up with your doctor tomorrow. Return to the emergency room at any time if you wish to continue your evaluation. As I explained to you we were unable to rule out a possibly life-threatening condition called aortic dissection or heart attack at this time. This could lead to stroke, heart attack, and even death.

## 2015-11-20 NOTE — ED Notes (Signed)
Pt in room crying, states he wants his IV out and to leave. EDP made aware and went to bedside to explain to pt the risks of leaving and his tx so far. Pt states that he is aware of the risks of leaving AMA and is upset over his treatments, states he is not willing to wait for test results.

## 2016-01-11 ENCOUNTER — Encounter: Payer: Self-pay | Admitting: Emergency Medicine

## 2016-01-11 ENCOUNTER — Emergency Department
Admission: EM | Admit: 2016-01-11 | Discharge: 2016-01-11 | Disposition: A | Discharge status: Home / Self Care | Payer: Medicaid Other | Attending: Emergency Medicine | Admitting: Emergency Medicine

## 2016-01-11 DIAGNOSIS — Z79899 Other long term (current) drug therapy: Secondary | ICD-10-CM | POA: Insufficient documentation

## 2016-01-11 DIAGNOSIS — H00012 Hordeolum externum right lower eyelid: Secondary | ICD-10-CM

## 2016-01-11 DIAGNOSIS — F1490 Cocaine use, unspecified, uncomplicated: Secondary | ICD-10-CM | POA: Diagnosis not present

## 2016-01-11 DIAGNOSIS — H5711 Ocular pain, right eye: Secondary | ICD-10-CM | POA: Diagnosis present

## 2016-01-11 DIAGNOSIS — F17200 Nicotine dependence, unspecified, uncomplicated: Secondary | ICD-10-CM | POA: Insufficient documentation

## 2016-01-11 MED ORDER — GENTAMICIN SULFATE 0.3 % OP SOLN: 1.0000 [drp] | OPHTHALMIC | 0 refills | Status: DC

## 2016-01-11 MED ORDER — OXYCODONE-ACETAMINOPHEN 5-325 MG PO TABS: 1.0000 | ORAL_TABLET | Freq: Four times a day (QID) | ORAL | 0 refills | Status: DC | PRN

## 2016-01-11 MED ORDER — TOBRAMYCIN 0.3 % OP SOLN
1.0000 [drp] | OPHTHALMIC | Status: DC
  Filled 2016-01-11: qty 5

## 2016-01-11 MED ORDER — OXYCODONE-ACETAMINOPHEN 5-325 MG PO TABS
1.0000 | ORAL_TABLET | Freq: Once | ORAL | Status: AC
  Administered 2016-01-11: 1 via ORAL

## 2016-01-11 MED ORDER — TRAMADOL HCL 50 MG PO TABS
ORAL_TABLET | ORAL | Status: AC
  Filled 2016-01-11: qty 1

## 2016-01-11 MED ORDER — TRAMADOL HCL 50 MG PO TABS: 50.0000 mg | ORAL_TABLET | Freq: Four times a day (QID) | ORAL | 0 refills | Status: DC | PRN

## 2016-01-11 MED ORDER — NEOMYCIN-POLYMYXIN-HC 3.5-10000-1 OT SUSP
OTIC | Status: AC
  Filled 2016-01-11: qty 10

## 2016-01-11 MED ORDER — TRAMADOL HCL 50 MG PO TABS: 50.0000 mg | ORAL_TABLET | Freq: Once | ORAL | Status: DC

## 2016-01-11 MED ORDER — OXYCODONE-ACETAMINOPHEN 5-325 MG PO TABS
ORAL_TABLET | ORAL | Status: AC
  Filled 2016-01-11: qty 1

## 2016-01-11 NOTE — ED Triage Notes (Signed)
Pt has swelling to rt eye - pt unable to open for visual acuity test
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
## 2016-10-06 ENCOUNTER — Emergency Department: Payer: Medicaid Other

## 2016-10-06 ENCOUNTER — Encounter: Payer: Self-pay | Admitting: Emergency Medicine

## 2016-10-06 ENCOUNTER — Emergency Department
Admission: EM | Admit: 2016-10-06 | Discharge: 2016-10-06 | Disposition: A | Discharge status: Home / Self Care | Payer: Medicaid Other | Attending: Student in an Organized Health Care Education/Training Program | Admitting: Student in an Organized Health Care Education/Training Program

## 2016-10-06 DIAGNOSIS — Y939 Activity, unspecified: Secondary | ICD-10-CM | POA: Insufficient documentation

## 2016-10-06 DIAGNOSIS — Y92410 Unspecified street and highway as the place of occurrence of the external cause: Secondary | ICD-10-CM | POA: Diagnosis not present

## 2016-10-06 DIAGNOSIS — S99922A Unspecified injury of left foot, initial encounter: Secondary | ICD-10-CM

## 2016-10-06 DIAGNOSIS — S92332A Displaced fracture of third metatarsal bone, left foot, initial encounter for closed fracture: Secondary | ICD-10-CM | POA: Insufficient documentation

## 2016-10-06 DIAGNOSIS — S92342A Displaced fracture of fourth metatarsal bone, left foot, initial encounter for closed fracture: Secondary | ICD-10-CM | POA: Insufficient documentation

## 2016-10-06 DIAGNOSIS — F17200 Nicotine dependence, unspecified, uncomplicated: Secondary | ICD-10-CM | POA: Diagnosis not present

## 2016-10-06 DIAGNOSIS — Y999 Unspecified external cause status: Secondary | ICD-10-CM | POA: Insufficient documentation

## 2016-10-06 DIAGNOSIS — S92325A Nondisplaced fracture of second metatarsal bone, left foot, initial encounter for closed fracture: Secondary | ICD-10-CM | POA: Insufficient documentation

## 2016-10-06 DIAGNOSIS — S92302A Fracture of unspecified metatarsal bone(s), left foot, initial encounter for closed fracture: Secondary | ICD-10-CM

## 2016-10-06 LAB — COMPREHENSIVE METABOLIC PANEL
ALBUMIN: 4.5 g/dL (ref 3.5–5.0)
ALK PHOS: 55 U/L (ref 38–126)
ALT: 13 U/L — AB (ref 17–63)
ANION GAP: 7 (ref 5–15)
AST: 21 U/L (ref 15–41)
BILIRUBIN TOTAL: 1 mg/dL (ref 0.3–1.2)
BUN: 9 mg/dL (ref 6–20)
CALCIUM: 9.4 mg/dL (ref 8.9–10.3)
CO2: 25 mmol/L (ref 22–32)
CREATININE: 1.07 mg/dL (ref 0.61–1.24)
Chloride: 108 mmol/L (ref 101–111)
GFR calc Af Amer: >60 mL/min (ref >60)
GFR calc non Af Amer: >60 mL/min (ref >60)
GLUCOSE: 125 mg/dL — AB (ref 65–99)
Potassium: 3.8 mmol/L (ref 3.5–5.1)
SODIUM: 140 mmol/L (ref 135–145)
Total Protein: 7.7 g/dL (ref 6.5–8.1)

## 2016-10-06 LAB — CBC WITH DIFFERENTIAL/PLATELET
BASOS ABS: 0 10*3/uL (ref 0–0.1)
BASOS PCT: 0 %
EOS ABS: 0 10*3/uL (ref 0–0.7)
Eosinophils Relative: 0 %
HEMATOCRIT: 44.4 % (ref 40.0–52.0)
HEMOGLOBIN: 15.2 g/dL (ref 13.0–18.0)
Lymphocytes Relative: 13 %
Lymphs Abs: 1.5 10*3/uL (ref 1.0–3.6)
MCH: 30.7 pg (ref 26.0–34.0)
MCHC: 34.3 g/dL (ref 32.0–36.0)
MCV: 89.6 fL (ref 80.0–100.0)
Monocytes Absolute: 0.5 10*3/uL (ref 0.2–1.0)
Monocytes Relative: 5 %
NEUTROS ABS: 9.4 10*3/uL — AB (ref 1.4–6.5)
NEUTROS PCT: 82 %
Platelets: 265 10*3/uL (ref 150–440)
RBC: 4.96 MIL/uL (ref 4.40–5.90)
RDW: 13.2 % (ref 11.5–14.5)
WBC: 11.4 10*3/uL — AB (ref 3.8–10.6)

## 2016-10-06 MED ORDER — FENTANYL CITRATE (PF) 100 MCG/2ML IJ SOLN
50.0000 ug | Freq: Once | INTRAMUSCULAR | Status: AC
Start: 2016-10-06 — End: 2016-10-06
  Administered 2016-10-06: 50 ug via INTRAVENOUS
  Filled 2016-10-06: qty 2

## 2016-10-06 MED ORDER — OXYCODONE-ACETAMINOPHEN 5-325 MG PO TABS: 1.0000 | ORAL_TABLET | Freq: Four times a day (QID) | ORAL | 0 refills | Status: DC | PRN

## 2016-10-06 MED ORDER — OXYCODONE-ACETAMINOPHEN 5-325 MG PO TABS
2.0000 | ORAL_TABLET | Freq: Once | ORAL | Status: AC
  Administered 2016-10-06: 2 via ORAL
  Filled 2016-10-06: qty 2

## 2016-10-06 MED ORDER — FENTANYL CITRATE (PF) 100 MCG/2ML IJ SOLN
100.0000 ug | INTRAMUSCULAR | Status: DC | PRN
  Administered 2016-10-06: 100 ug via INTRAVENOUS
  Filled 2016-10-06: qty 2

## 2016-10-06 MED ORDER — POLYETHYLENE GLYCOL 3350 17 G PO PACK: 17.0000 g | PACK | Freq: Every day | ORAL | 0 refills | Status: DC

## 2016-10-06 MED ORDER — IBUPROFEN 400 MG PO TABS
600.0000 mg | ORAL_TABLET | Freq: Once | ORAL | Status: AC
  Administered 2016-10-06: 600 mg via ORAL
  Filled 2016-10-06: qty 2

## 2016-10-06 NOTE — ED Notes (Signed)
Pt. Going home with friends.
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
## 2016-10-06 NOTE — Discharge Instructions (Signed)
BE SURE TO TAKE A DAILY ASPIRIN 325 TO REDUCE RISK OF BLOOD CLOT WHILE IN A CAST.

## 2016-10-09 ENCOUNTER — Emergency Department (HOSPITAL_COMMUNITY)
Admission: EM | Admit: 2016-10-09 | Discharge: 2016-10-09 | Disposition: A | Discharge status: Home / Self Care | Payer: Medicaid Other | Attending: Emergency Medicine | Admitting: Emergency Medicine

## 2016-10-09 ENCOUNTER — Encounter (HOSPITAL_COMMUNITY): Payer: Self-pay | Admitting: Adult Health

## 2016-10-09 DIAGNOSIS — X58XXXD Exposure to other specified factors, subsequent encounter: Secondary | ICD-10-CM | POA: Diagnosis not present

## 2016-10-09 DIAGNOSIS — H00014 Hordeolum externum left upper eyelid: Secondary | ICD-10-CM

## 2016-10-09 DIAGNOSIS — S92902D Unspecified fracture of left foot, subsequent encounter for fracture with routine healing: Secondary | ICD-10-CM

## 2016-10-09 DIAGNOSIS — S99922D Unspecified injury of left foot, subsequent encounter: Secondary | ICD-10-CM | POA: Diagnosis present

## 2016-10-09 DIAGNOSIS — F17210 Nicotine dependence, cigarettes, uncomplicated: Secondary | ICD-10-CM | POA: Diagnosis not present

## 2016-10-09 DIAGNOSIS — S92202D Fracture of unspecified tarsal bone(s) of left foot, subsequent encounter for fracture with routine healing: Secondary | ICD-10-CM | POA: Insufficient documentation

## 2016-10-09 MED ORDER — OXYCODONE-ACETAMINOPHEN 5-325 MG PO TABS
1.0000 | ORAL_TABLET | Freq: Once | ORAL | Status: AC
  Administered 2016-10-09: 1 via ORAL
  Filled 2016-10-09: qty 1

## 2016-10-09 MED ORDER — OXYCODONE-ACETAMINOPHEN 5-325 MG PO TABS: 1.0000 | ORAL_TABLET | Freq: Four times a day (QID) | ORAL | 0 refills | Status: AC | PRN

## 2016-10-09 MED ORDER — BACITRACIN-POLYMYXIN B 500-10000 UNIT/GM OP OINT: 1.0000 "application " | TOPICAL_OINTMENT | Freq: Two times a day (BID) | OPHTHALMIC | 0 refills | Status: DC

## 2016-10-09 NOTE — ED Notes (Signed)
Pt reports that he broke his foot this week, had cast applied. Now with pain and swelling and could not feel his toes.

## 2016-10-09 NOTE — ED Notes (Signed)
Splint loosened at triage.
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
## 2017-07-13 ENCOUNTER — Emergency Department
Admission: EM | Admit: 2017-07-13 | Discharge: 2017-07-14 | Disposition: A | Discharge status: Home / Self Care | Payer: Medicaid Other | Attending: Emergency Medicine | Admitting: Emergency Medicine

## 2017-07-13 ENCOUNTER — Other Ambulatory Visit: Payer: Self-pay

## 2017-07-13 DIAGNOSIS — M545 Low back pain, unspecified: Secondary | ICD-10-CM

## 2017-07-13 DIAGNOSIS — F17210 Nicotine dependence, cigarettes, uncomplicated: Secondary | ICD-10-CM | POA: Diagnosis not present

## 2017-07-13 DIAGNOSIS — M7121 Synovial cyst of popliteal space [Baker], right knee: Secondary | ICD-10-CM | POA: Diagnosis not present

## 2017-07-13 DIAGNOSIS — M79604 Pain in right leg: Secondary | ICD-10-CM | POA: Diagnosis present

## 2017-07-13 LAB — URINALYSIS, ROUTINE W REFLEX MICROSCOPIC
Bacteria, UA: NONE SEEN
Bilirubin Urine: NEGATIVE
GLUCOSE, UA: NEGATIVE mg/dL
HGB URINE DIPSTICK: NEGATIVE
Ketones, ur: NEGATIVE mg/dL
NITRITE: NEGATIVE
PH: 5 (ref 5.0–8.0)
PROTEIN: NEGATIVE mg/dL
Specific Gravity, Urine: 1.032 — ABNORMAL HIGH (ref 1.005–1.030)

## 2017-07-13 LAB — CBC
HEMATOCRIT: 44.6 % (ref 40.0–52.0)
HEMOGLOBIN: 15.4 g/dL (ref 13.0–18.0)
MCH: 31.2 pg (ref 26.0–34.0)
MCHC: 34.6 g/dL (ref 32.0–36.0)
MCV: 90.3 fL (ref 80.0–100.0)
Platelets: 216 10*3/uL (ref 150–440)
RBC: 4.94 MIL/uL (ref 4.40–5.90)
RDW: 12.9 % (ref 11.5–14.5)
WBC: 11 10*3/uL — ABNORMAL HIGH (ref 3.8–10.6)

## 2017-07-13 LAB — COMPREHENSIVE METABOLIC PANEL
ALT: 12 U/L — ABNORMAL LOW (ref 17–63)
AST: 19 U/L (ref 15–41)
Albumin: 4.2 g/dL (ref 3.5–5.0)
Alkaline Phosphatase: 59 U/L (ref 38–126)
Anion gap: 8 (ref 5–15)
BILIRUBIN TOTAL: 1 mg/dL (ref 0.3–1.2)
BUN: 11 mg/dL (ref 6–20)
CHLORIDE: 108 mmol/L (ref 101–111)
CO2: 24 mmol/L (ref 22–32)
CREATININE: 1.02 mg/dL (ref 0.61–1.24)
Calcium: 8.5 mg/dL — ABNORMAL LOW (ref 8.9–10.3)
Glucose, Bld: 109 mg/dL — ABNORMAL HIGH (ref 65–99)
POTASSIUM: 3.6 mmol/L (ref 3.5–5.1)
Sodium: 140 mmol/L (ref 135–145)
TOTAL PROTEIN: 7.3 g/dL (ref 6.5–8.1)
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
## 2017-07-14 ENCOUNTER — Emergency Department: Payer: Medicaid Other

## 2017-07-14 MED ORDER — MORPHINE SULFATE (PF) 4 MG/ML IV SOLN
4.0000 mg | Freq: Once | INTRAVENOUS | Status: AC
  Administered 2017-07-14: 4 mg via INTRAVENOUS
  Filled 2017-07-14: qty 1

## 2017-07-14 MED ORDER — ONDANSETRON HCL 4 MG/2ML IJ SOLN
INTRAMUSCULAR | Status: AC
  Filled 2017-07-14: qty 2

## 2017-07-14 MED ORDER — KETOROLAC TROMETHAMINE 10 MG PO TABS: 10.0000 mg | ORAL_TABLET | Freq: Four times a day (QID) | ORAL | 0 refills | Status: DC | PRN

## 2017-07-14 MED ORDER — CYCLOBENZAPRINE HCL 10 MG PO TABS: 10.0000 mg | ORAL_TABLET | Freq: Three times a day (TID) | ORAL | 0 refills | Status: DC | PRN
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
## 2018-05-06 ENCOUNTER — Encounter: Payer: Self-pay | Admitting: Emergency Medicine

## 2018-05-06 ENCOUNTER — Emergency Department
Admission: EM | Admit: 2018-05-06 | Discharge: 2018-05-06 | Disposition: A | Discharge status: Home / Self Care | Payer: Medicaid Other | Attending: Emergency Medicine | Admitting: Emergency Medicine

## 2018-05-06 ENCOUNTER — Other Ambulatory Visit: Payer: Self-pay

## 2018-05-06 DIAGNOSIS — F17200 Nicotine dependence, unspecified, uncomplicated: Secondary | ICD-10-CM | POA: Insufficient documentation

## 2018-05-06 DIAGNOSIS — K029 Dental caries, unspecified: Secondary | ICD-10-CM | POA: Diagnosis not present

## 2018-05-06 DIAGNOSIS — K0889 Other specified disorders of teeth and supporting structures: Secondary | ICD-10-CM | POA: Diagnosis present

## 2018-05-06 MED ORDER — HYDROCODONE-ACETAMINOPHEN 5-325 MG PO TABS
1.0000 | ORAL_TABLET | Freq: Once | ORAL | Status: AC
  Administered 2018-05-06: 1 via ORAL
  Filled 2018-05-06: qty 1

## 2018-05-06 MED ORDER — TRAMADOL HCL 50 MG PO TABS: 50.0000 mg | ORAL_TABLET | Freq: Four times a day (QID) | ORAL | 0 refills | Status: DC | PRN

## 2018-05-06 MED ORDER — AMOXICILLIN 500 MG PO CAPS: 500.0000 mg | ORAL_CAPSULE | Freq: Three times a day (TID) | ORAL | 0 refills | Status: DC
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
## 2018-05-06 NOTE — Discharge Instructions (Addendum)
Follow-up with [facility] dental clinic.  Please call to make sure they are going to be open on Monday before going out there.  Asked them the days for their dental clinic.  Take the medications as prescribed.  We do not pull teeth here in this emergency department.

## 2018-05-06 NOTE — ED Triage Notes (Signed)
L lower dental pain since yesterday.  

## 2018-05-06 NOTE — ED Notes (Signed)
See triage note  Presents with dental pain  States he thinks he broke a tooth  Left upper   States this happened 2 days ago

## 2018-11-13 ENCOUNTER — Emergency Department
Admission: EM | Admit: 2018-11-13 | Discharge: 2018-11-13 | Disposition: A | Discharge status: Home / Self Care | Payer: Medicaid Other | Attending: Student | Admitting: Student

## 2018-11-13 ENCOUNTER — Other Ambulatory Visit: Payer: Self-pay

## 2018-11-13 ENCOUNTER — Encounter: Payer: Self-pay | Admitting: Emergency Medicine

## 2018-11-13 DIAGNOSIS — Z23 Encounter for immunization: Secondary | ICD-10-CM | POA: Diagnosis not present

## 2018-11-13 DIAGNOSIS — F17210 Nicotine dependence, cigarettes, uncomplicated: Secondary | ICD-10-CM | POA: Insufficient documentation

## 2018-11-13 DIAGNOSIS — Y9301 Activity, walking, marching and hiking: Secondary | ICD-10-CM | POA: Diagnosis not present

## 2018-11-13 DIAGNOSIS — S81851A Open bite, right lower leg, initial encounter: Secondary | ICD-10-CM | POA: Diagnosis not present

## 2018-11-13 DIAGNOSIS — W540XXA Bitten by dog, initial encounter: Secondary | ICD-10-CM | POA: Diagnosis not present

## 2018-11-13 DIAGNOSIS — Y92410 Unspecified street and highway as the place of occurrence of the external cause: Secondary | ICD-10-CM | POA: Insufficient documentation

## 2018-11-13 DIAGNOSIS — Y999 Unspecified external cause status: Secondary | ICD-10-CM | POA: Diagnosis not present

## 2018-11-13 MED ORDER — TETANUS-DIPHTH-ACELL PERTUSSIS 5-2.5-18.5 LF-MCG/0.5 IM SUSP
0.5000 mL | Freq: Once | INTRAMUSCULAR | Status: AC
  Administered 2018-11-13: 0.5 mL via INTRAMUSCULAR
  Filled 2018-11-13: qty 0.5

## 2018-11-13 MED ORDER — AMOXICILLIN-POT CLAVULANATE 875-125 MG PO TABS
1.0000 | ORAL_TABLET | Freq: Once | ORAL | Status: AC
  Administered 2018-11-13: 1 via ORAL
  Filled 2018-11-13: qty 1

## 2018-11-13 MED ORDER — AMOXICILLIN-POT CLAVULANATE 875-125 MG PO TABS: 1.0000 | ORAL_TABLET | Freq: Two times a day (BID) | ORAL | 0 refills | Status: AC

## 2018-11-13 NOTE — Discharge Instructions (Signed)
Keep the wound clean, dry, and covered. Cleanse with soap & water (saline) and avoid using peroxide or alcohol. Rest with the foot elevated when seated. Take the antibiotic as directed until all pills are gone. Return as needed.
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
## 2018-11-13 NOTE — ED Triage Notes (Signed)
Pt arrives with concerns over dog bite to pt's right calf from dog attack Saturday.

## 2018-11-13 NOTE — ED Notes (Signed)
See triage note  States he was bitten by neighbors dog on Saturday    Bite noted to right lower leg  Small amt of bleeding noted  Also having increased pain

## 2018-11-26 ENCOUNTER — Emergency Department: Payer: Medicaid Other

## 2018-11-26 ENCOUNTER — Other Ambulatory Visit: Payer: Self-pay

## 2018-11-26 ENCOUNTER — Encounter: Payer: Self-pay | Admitting: Intensive Care

## 2018-11-26 ENCOUNTER — Emergency Department
Admission: EM | Admit: 2018-11-26 | Discharge: 2018-11-27 | Disposition: A | Discharge status: Home / Self Care | Payer: Medicaid Other | Attending: Emergency Medicine | Admitting: Emergency Medicine

## 2018-11-26 DIAGNOSIS — F17210 Nicotine dependence, cigarettes, uncomplicated: Secondary | ICD-10-CM | POA: Diagnosis not present

## 2018-11-26 DIAGNOSIS — Z86711 Personal history of pulmonary embolism: Secondary | ICD-10-CM | POA: Insufficient documentation

## 2018-11-26 DIAGNOSIS — Z5189 Encounter for other specified aftercare: Secondary | ICD-10-CM

## 2018-11-26 DIAGNOSIS — Z4801 Encounter for change or removal of surgical wound dressing: Secondary | ICD-10-CM | POA: Insufficient documentation

## 2018-11-26 DIAGNOSIS — R2243 Localized swelling, mass and lump, lower limb, bilateral: Secondary | ICD-10-CM | POA: Diagnosis present

## 2018-11-26 DIAGNOSIS — R600 Localized edema: Secondary | ICD-10-CM

## 2018-11-26 LAB — COMPREHENSIVE METABOLIC PANEL
ALT: 17 U/L (ref 0–44)
AST: 18 U/L (ref 15–41)
Albumin: 3.5 g/dL (ref 3.5–5.0)
Alkaline Phosphatase: 49 U/L (ref 38–126)
Anion gap: 5 (ref 5–15)
BUN: 14 mg/dL (ref 6–20)
CO2: 25 mmol/L (ref 22–32)
Calcium: 8.4 mg/dL — ABNORMAL LOW (ref 8.9–10.3)
Chloride: 111 mmol/L (ref 98–111)
Creatinine, Ser: 1.11 mg/dL (ref 0.61–1.24)
GFR calc Af Amer: >60 mL/min (ref >60)
GFR calc non Af Amer: >60 mL/min (ref >60)
Glucose, Bld: 100 mg/dL — ABNORMAL HIGH (ref 70–99)
Potassium: 4 mmol/L (ref 3.5–5.1)
Sodium: 141 mmol/L (ref 135–145)
Total Bilirubin: 0.5 mg/dL (ref 0.3–1.2)
Total Protein: 6.1 g/dL — ABNORMAL LOW (ref 6.5–8.1)

## 2018-11-26 LAB — CBC WITH DIFFERENTIAL/PLATELET
Abs Immature Granulocytes: 0.03 10*3/uL (ref 0.00–0.07)
Basophils Absolute: 0.1 10*3/uL (ref 0.0–0.1)
Basophils Relative: 1 %
Eosinophils Absolute: 0.4 10*3/uL (ref 0.0–0.5)
Eosinophils Relative: 4 %
HCT: 41.4 % (ref 39.0–52.0)
Hemoglobin: 13.9 g/dL (ref 13.0–17.0)
Immature Granulocytes: 0 %
Lymphocytes Relative: 21 %
Lymphs Abs: 2.2 10*3/uL (ref 0.7–4.0)
MCH: 30.5 pg (ref 26.0–34.0)
MCHC: 33.6 g/dL (ref 30.0–36.0)
MCV: 91 fL (ref 80.0–100.0)
Monocytes Absolute: 1 10*3/uL (ref 0.1–1.0)
Monocytes Relative: 9 %
Neutro Abs: 6.9 10*3/uL (ref 1.7–7.7)
Neutrophils Relative %: 65 %
Platelets: 199 10*3/uL (ref 150–400)
RBC: 4.55 MIL/uL (ref 4.22–5.81)
RDW: 13.2 % (ref 11.5–15.5)
WBC: 10.6 10*3/uL — ABNORMAL HIGH (ref 4.0–10.5)
nRBC: 0 % (ref 0.0–0.2)

## 2018-11-26 LAB — PROTIME-INR
INR: 0.9 (ref 0.8–1.2)
Prothrombin Time: 12.3 seconds (ref 11.4–15.2)

## 2018-11-26 LAB — URINALYSIS, COMPLETE (UACMP) WITH MICROSCOPIC
Bacteria, UA: NONE SEEN
Bilirubin Urine: NEGATIVE
Glucose, UA: NEGATIVE mg/dL
Hgb urine dipstick: NEGATIVE
Ketones, ur: 5 mg/dL — AB
Leukocytes,Ua: NEGATIVE
Nitrite: NEGATIVE
Protein, ur: NEGATIVE mg/dL
Specific Gravity, Urine: 1.032 — ABNORMAL HIGH (ref 1.005–1.030)
Squamous Epithelial / HPF: NONE SEEN (ref 0–5)
pH: 5 (ref 5.0–8.0)

## 2018-11-26 LAB — BRAIN NATRIURETIC PEPTIDE: B Natriuretic Peptide: 14 pg/mL (ref 0.0–100.0)

## 2018-11-26 MED ORDER — HYDROCHLOROTHIAZIDE 12.5 MG PO CAPS: 12.5000 mg | ORAL_CAPSULE | Freq: Every day | ORAL | 2 refills | Status: AC

## 2018-11-26 MED ORDER — LIDOCAINE HCL (PF) 1 % IJ SOLN
5.0000 mL | Freq: Once | INTRAMUSCULAR | Status: AC
  Administered 2018-11-26: 5 mL
  Filled 2018-11-26: qty 5

## 2018-11-26 MED ORDER — SODIUM CHLORIDE 0.9 % IV SOLN
1.5000 g | Freq: Once | INTRAVENOUS | Status: AC
  Administered 2018-11-26: 23:00:00 1.5 g via INTRAVENOUS
  Filled 2018-11-26: qty 4
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
## 2018-11-26 NOTE — ED Triage Notes (Signed)
Patient was bitten by a dog on 11/13/18 and sent home with antibiotics. C/o swelling on right leg from knee down to ankle. Reports he is almost finished with first round of antibiotiocs

## 2018-11-26 NOTE — ED Notes (Signed)
Teresa from radiology at bedside to perform [place] to both lower extremities

## 2018-11-26 NOTE — Discharge Instructions (Addendum)
Your exam, labs, and Ultrasound are all normal at this time. You do not have a blood clot in the legs, or signs of a worsening infection causing blood poisoning. You do have edema in the legs. The exact cause is not known, but may be caused by uncontrolled high blood pressure. Rest with the legs elevated, avoid added salt (sodium) and drink plenty of water. Take the prescription fluid pill as directed, and record your BP readings. Follow-up with [facility] or return as needed.

## 2019-11-16 ENCOUNTER — Other Ambulatory Visit: Payer: Self-pay

## 2019-11-16 ENCOUNTER — Emergency Department
Admission: EM | Admit: 2019-11-16 | Discharge: 2019-11-16 | Disposition: A | Discharge status: Home / Self Care | Payer: Medicaid Other | Attending: Emergency Medicine | Admitting: Emergency Medicine

## 2019-11-16 ENCOUNTER — Emergency Department: Payer: Medicaid Other

## 2019-11-16 ENCOUNTER — Emergency Department (HOSPITAL_COMMUNITY)
Admission: EM | Admit: 2019-11-16 | Discharge: 2019-11-16 | Discharge status: Against Medical Advice | Payer: Medicaid Other

## 2019-11-16 DIAGNOSIS — N5082 Scrotal pain: Secondary | ICD-10-CM | POA: Diagnosis not present

## 2019-11-16 DIAGNOSIS — F17210 Nicotine dependence, cigarettes, uncomplicated: Secondary | ICD-10-CM | POA: Diagnosis not present

## 2019-11-16 DIAGNOSIS — Z20822 Contact with and (suspected) exposure to covid-19: Secondary | ICD-10-CM | POA: Insufficient documentation

## 2019-11-16 DIAGNOSIS — N50811 Right testicular pain: Secondary | ICD-10-CM | POA: Diagnosis present

## 2019-11-16 DIAGNOSIS — N453 Epididymo-orchitis: Secondary | ICD-10-CM | POA: Diagnosis not present

## 2019-11-16 DIAGNOSIS — Z79899 Other long term (current) drug therapy: Secondary | ICD-10-CM | POA: Insufficient documentation

## 2019-11-16 DIAGNOSIS — I10 Essential (primary) hypertension: Secondary | ICD-10-CM

## 2019-11-16 LAB — URINALYSIS, COMPLETE (UACMP) WITH MICROSCOPIC
Bacteria, UA: NONE SEEN
Bilirubin Urine: NEGATIVE
Glucose, UA: NEGATIVE mg/dL
Hgb urine dipstick: NEGATIVE
Ketones, ur: NEGATIVE mg/dL
Leukocytes,Ua: NEGATIVE
Nitrite: NEGATIVE
Protein, ur: 30 mg/dL — AB
RBC / HPF: >50 RBC/hpf — ABNORMAL HIGH (ref 0–5)
Specific Gravity, Urine: 1.031 — ABNORMAL HIGH (ref 1.005–1.030)
Squamous Epithelial / HPF: NONE SEEN (ref 0–5)
WBC, UA: >50 WBC/hpf — ABNORMAL HIGH (ref 0–5)
pH: 5 (ref 5.0–8.0)

## 2019-11-16 LAB — CBC
HCT: 44.9 % (ref 39.0–52.0)
Hemoglobin: 16.2 g/dL (ref 13.0–17.0)
MCH: 31.5 pg (ref 26.0–34.0)
MCHC: 36.1 g/dL — ABNORMAL HIGH (ref 30.0–36.0)
MCV: 87.2 fL (ref 80.0–100.0)
Platelets: 274 10*3/uL (ref 150–400)
RBC: 5.15 MIL/uL (ref 4.22–5.81)
RDW: 12.6 % (ref 11.5–15.5)
WBC: 16.1 10*3/uL — ABNORMAL HIGH (ref 4.0–10.5)
nRBC: 0 % (ref 0.0–0.2)

## 2019-11-16 LAB — BASIC METABOLIC PANEL
Anion gap: 9 (ref 5–15)
BUN: 9 mg/dL (ref 6–20)
CO2: 26 mmol/L (ref 22–32)
Calcium: 8.7 mg/dL — ABNORMAL LOW (ref 8.9–10.3)
Chloride: 104 mmol/L (ref 98–111)
Creatinine, Ser: 0.91 mg/dL (ref 0.61–1.24)
GFR calc Af Amer: >60 mL/min (ref >60)
GFR calc non Af Amer: >60 mL/min (ref >60)
Glucose, Bld: 99 mg/dL (ref 70–99)
Potassium: 3.5 mmol/L (ref 3.5–5.1)
Sodium: 139 mmol/L (ref 135–145)

## 2019-11-16 LAB — RESPIRATORY PANEL BY RT PCR (FLU A&B, COVID)
Influenza A by PCR: NEGATIVE
Influenza B by PCR: NEGATIVE
SARS Coronavirus 2 by RT PCR: NEGATIVE

## 2019-11-16 MED ORDER — ACETAMINOPHEN 500 MG PO TABS
1000.0000 mg | ORAL_TABLET | Freq: Once | ORAL | Status: AC
  Administered 2019-11-16: 1000 mg via ORAL
  Filled 2019-11-16: qty 2

## 2019-11-16 MED ORDER — SODIUM CHLORIDE 0.9 % IV SOLN
1.0000 g | Freq: Once | INTRAVENOUS | Status: AC
  Administered 2019-11-16: 1 g via INTRAVENOUS
  Filled 2019-11-16: qty 10

## 2019-11-16 MED ORDER — KETOROLAC TROMETHAMINE 30 MG/ML IJ SOLN
30.0000 mg | Freq: Once | INTRAMUSCULAR | Status: AC
  Administered 2019-11-16: 30 mg via INTRAVENOUS
  Filled 2019-11-16: qty 1

## 2019-11-16 MED ORDER — HYDROMORPHONE HCL 1 MG/ML IJ SOLN
1.0000 mg | Freq: Once | INTRAMUSCULAR | Status: AC
  Administered 2019-11-16: 1 mg via INTRAVENOUS
  Filled 2019-11-16: qty 1

## 2019-11-16 MED ORDER — LACTATED RINGERS IV BOLUS
1000.0000 mL | Freq: Once | INTRAVENOUS | Status: AC
  Administered 2019-11-16: 1000 mL via INTRAVENOUS

## 2019-11-16 MED ORDER — DOXYCYCLINE HYCLATE 100 MG PO CAPS: 100.0000 mg | ORAL_CAPSULE | Freq: Two times a day (BID) | ORAL | 0 refills | Status: AC

## 2019-11-16 NOTE — ED Notes (Signed)
Disregard UA results from 2133 incorrect results

## 2019-11-16 NOTE — ED Notes (Signed)
ED Provider at bedside. 

## 2019-11-16 NOTE — ED Notes (Signed)
Per lab, urine sample resulted at 2032 incorrectly attributed to this patient. Lab will cancel. MD notified.

## 2019-11-16 NOTE — ED Notes (Signed)
Pt placed on room air per physician. Pt maintained oxygen saturation at 97-100 percent.
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
## 2019-11-16 NOTE — ED Triage Notes (Signed)
Pt with right sided testicular swelling for two days. Pt denies known hematuria, penile discharge.

## 2019-11-16 NOTE — ED Notes (Signed)
Report given to Kendall RN 

## 2019-11-17 LAB — CHLAMYDIA/NGC RT PCR (ARMC ONLY)
Chlamydia Tr: NOT DETECTED
N gonorrhoeae: NOT DETECTED

## 2019-11-17 NOTE — ED Provider Notes (Signed)
Pharmacy says they do not have the prescription for doxycycline so I called in doxycycline 101 twice a day for 14 days for Mr. Joe Ramirez.   Arnaldo Natal, MD 11/17/19 1432

## 2019-11-18 LAB — URINE CULTURE: Culture: NO GROWTH

## 2020-10-16 DEATH — deceased

## 2020-11-30 IMAGING — US US SCROTUM W/ DOPPLER COMPLETE
1 series · 15 of 25 positions shown · non-contrast
Comparison: None.
COMPARISON: None.

Addendum:
CLINICAL DATA: Right scrotal pain for 2 days, worsening

EXAM:
SCROTAL ULTRASOUND
DOPPLER ULTRASOUND OF THE TESTICLES
TECHNIQUE: Complete ultrasound examination of the testicles, epididymis, and
other scrotal structures was performed. Color and spectral Doppler
ultrasound were also utilized to evaluate blood flow to the
testicles.

[Series 1: us scrotum w/doppler · 43 acquisitions, 15 frames shown]
[im 1/43]
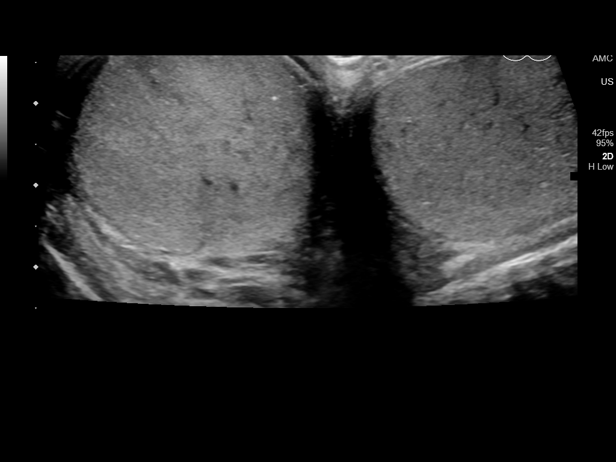
[im 4/43]
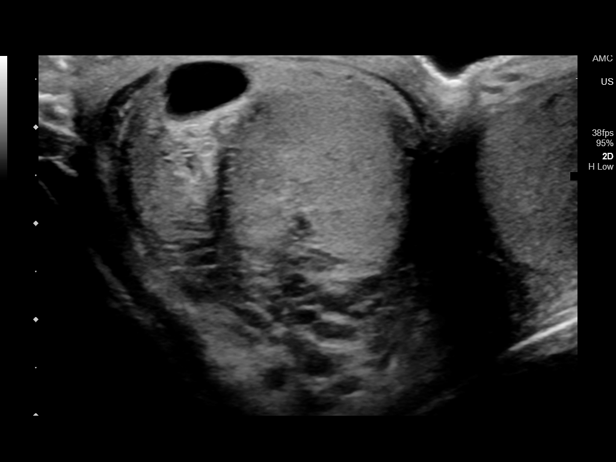
[im 8/43]
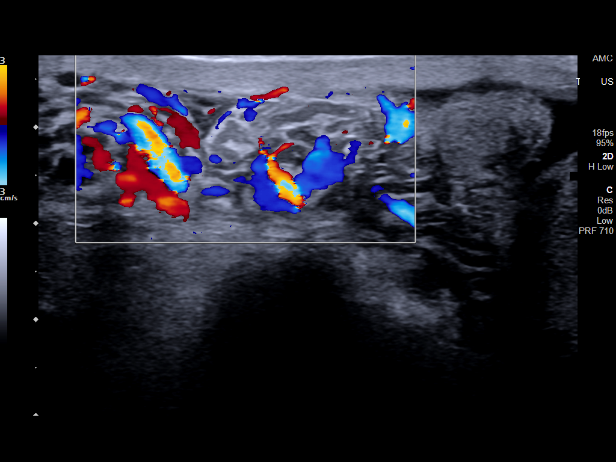
[im 9/43]
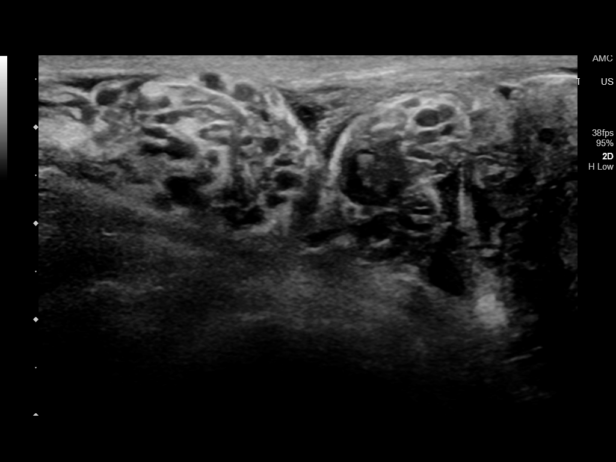
[im 13/43]
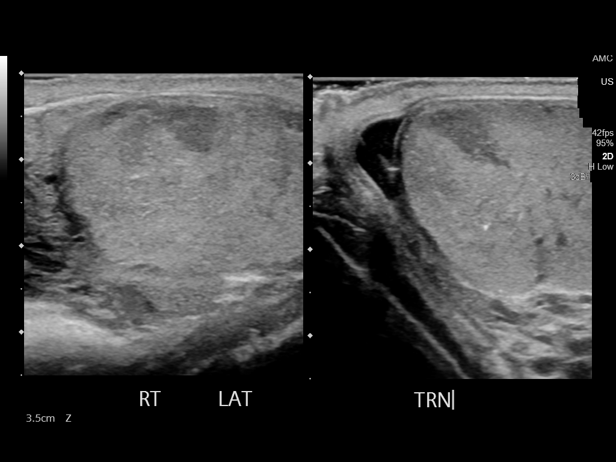
[im 16/43]
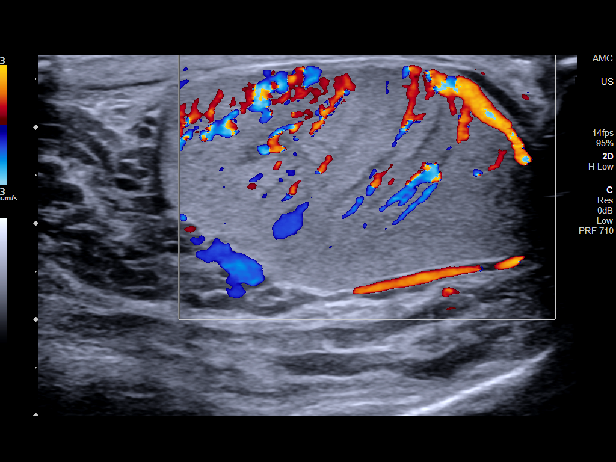
[im 18/43]
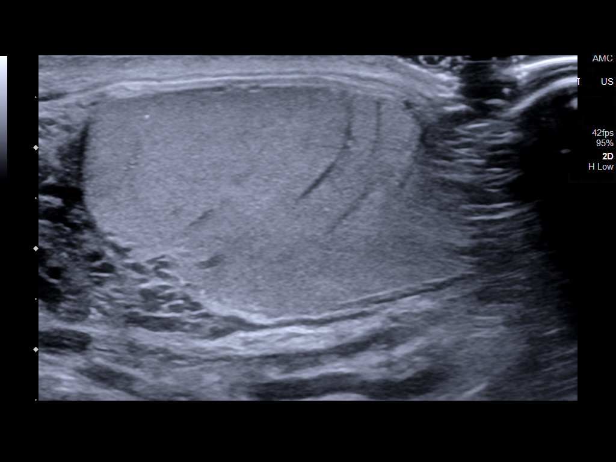
[im 22/43]
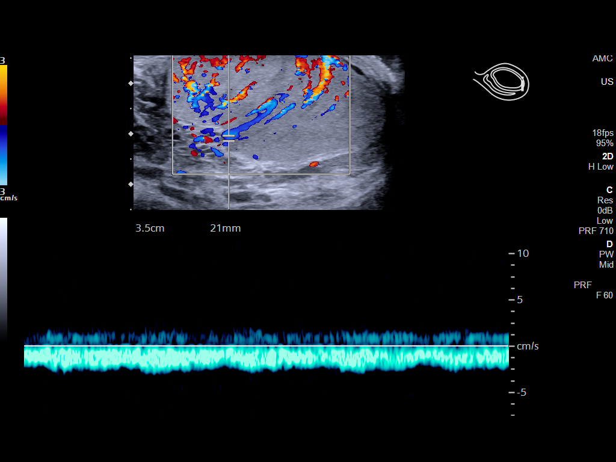
[im 25/43]
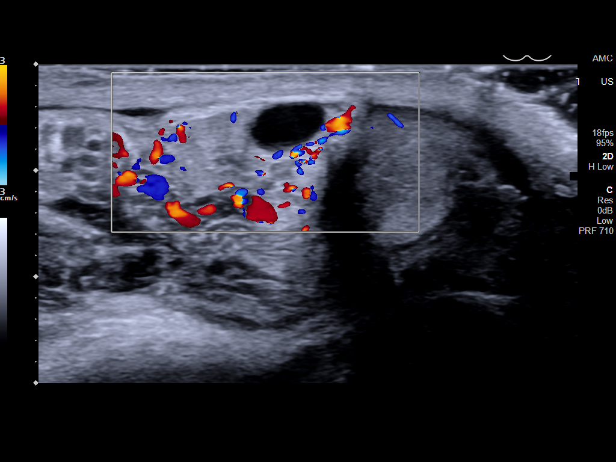
[im 27/43]
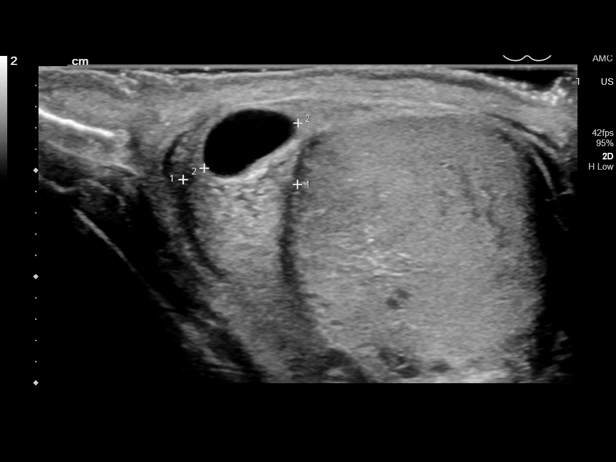
[im 30/43]
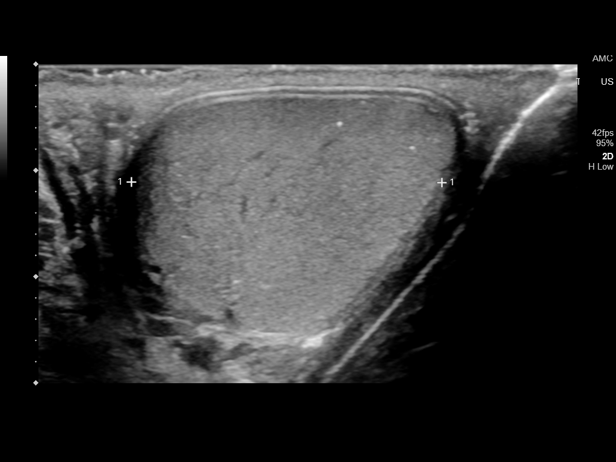
[im 34/43]
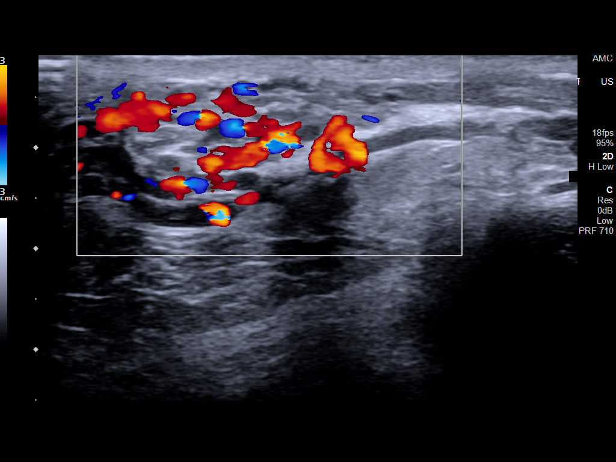
[im 36/43]
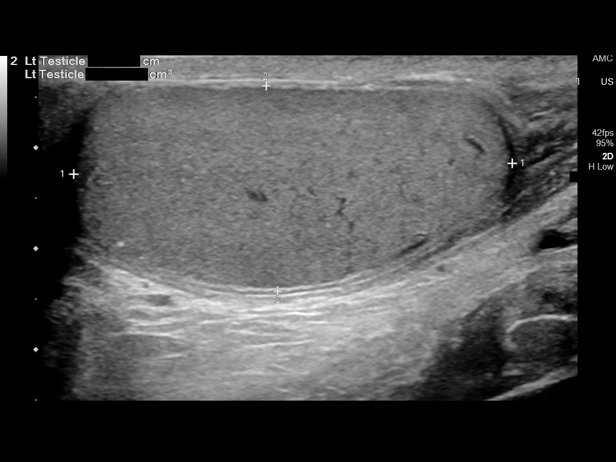
[im 39/43]
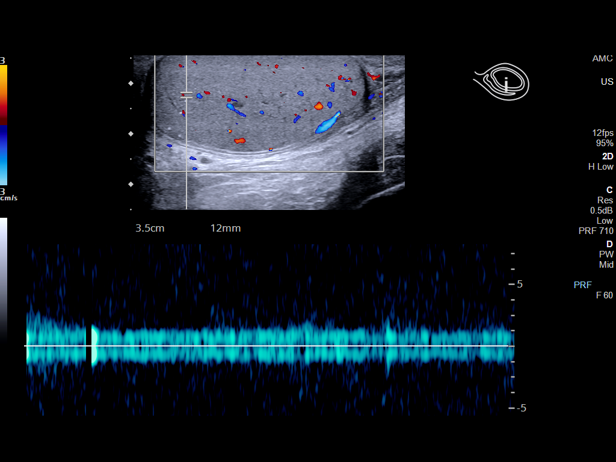
[im 43/43]
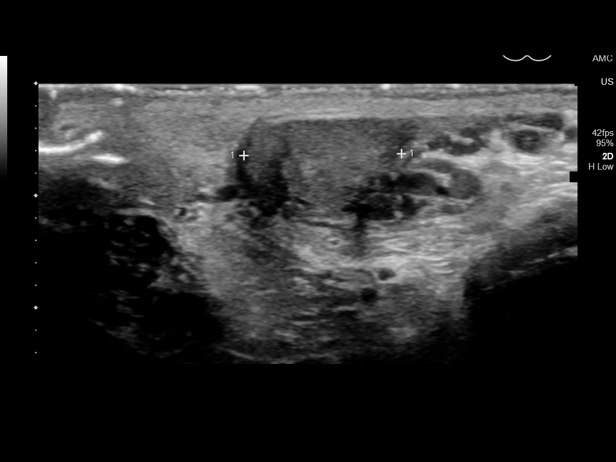

[15 of 25 positions shown; findings below may reference images not displayed]

FINDINGS: Right testicle

Measurements: 4.3 x 2.4 x 2.9 cm. There is a early pronounced,
striated appearance of the right testicle with some more ill-defined
heterogeneity towards the upper pole of the right testis with some
conspicuous lack of color flow in this region. Scattered microliths
are noted as well.

Left testicle

Measurements: 4.5 x 2.0 x 2.9 cm. Normal uniform parenchyma. No
concerning testicular mass. Scattered microliths.

Right epididymis: Anechoic simple appearing cyst in the head of the
epididymis measuring up to 10 mm in size. Otherwise normal
appearance and vascularity of the epididymis.

Left epididymis:  Normal in size and appearance.

Hydrocele:  Small right hydrocele.

Varicocele:  None visualized.

Pulsed Doppler interrogation of both testes demonstrates normal low
resistance arterial and venous waveforms bilaterally.
IMPRESSION: 1. Patchy, heterogeneity in an almost wedge like hypoechoic
appearance of the superior pole right testicle (14/44) with
conspicuous lack of color flow (20/44) is suspicious for regional
testicular infarct secondary to torsion. There is preserved flow in
the inferior pole but with some demonstrable twisting and edematous
changes of the spermatic cord. Urgent urologic consultation is
warranted.
2. 10 mm simple appearing cyst in the head of the epididymis on the
right. Could consider follow-up ultrasound in 6-8 weeks to assess
for resolution.
3. Small right hydrocele, likely reactive.
4. Scattered microliths in the testes bilaterally, a benign
incidental finding which requires no further follow-up. Current
literature suggests that testicular microlithiasis is not a
significant independent risk factor for development of testicular
carcinoma, and that follow up imaging is not warranted in the
absence of other risk factors. Monthly testicular self-examination
and annual physical exams are considered appropriate surveillance.
If patient has other risk factors for testicular carcinoma, then
referral to Urology should be considered. (Reference: Cengu, et
al.: A 5-Year Follow up Study of Asymptomatic Men with Testicular
Microlithiasis. J Urol 4114; [id number].).

Critical Value/emergent results were called by telephone at the time
of interpretation on 11/16/2019 at [DATE] to provider GJ
ELIZ , who verbally acknowledged these results.

ADDENDUM:
Impression Point #2 should read:

2. A 10 mm simple appearing cyst in the head of the right
epididymis. Typically a benign incidental finding warranting no
routine imaging evaluation.

Follow-up recommendations on the initial report were entered in
error.

*** End of Addendum ***
FINDINGS: Right testicle

Measurements: 4.3 x 2.4 x 2.9 cm. There is a early pronounced,
striated appearance of the right testicle with some more ill-defined
heterogeneity towards the upper pole of the right testis with some
conspicuous lack of color flow in this region. Scattered microliths
are noted as well.

Left testicle

Measurements: 4.5 x 2.0 x 2.9 cm. Normal uniform parenchyma. No
concerning testicular mass. Scattered microliths.

Right epididymis: Anechoic simple appearing cyst in the head of the
epididymis measuring up to 10 mm in size. Otherwise normal
appearance and vascularity of the epididymis.

Left epididymis:  Normal in size and appearance.

Hydrocele:  Small right hydrocele.

Varicocele:  None visualized.

Pulsed Doppler interrogation of both testes demonstrates normal low
resistance arterial and venous waveforms bilaterally.
IMPRESSION: 1. Patchy, heterogeneity in an almost wedge like hypoechoic
appearance of the superior pole right testicle (14/44) with
conspicuous lack of color flow (20/44) is suspicious for regional
testicular infarct secondary to torsion. There is preserved flow in
the inferior pole but with some demonstrable twisting and edematous
changes of the spermatic cord. Urgent urologic consultation is
warranted.
2. 10 mm simple appearing cyst in the head of the epididymis on the
right. Could consider follow-up ultrasound in 6-8 weeks to assess
for resolution.
3. Small right hydrocele, likely reactive.
4. Scattered microliths in the testes bilaterally, a benign
incidental finding which requires no further follow-up. Current
literature suggests that testicular microlithiasis is not a
significant independent risk factor for development of testicular
carcinoma, and that follow up imaging is not warranted in the
absence of other risk factors. Monthly testicular self-examination
and annual physical exams are considered appropriate surveillance.
If patient has other risk factors for testicular carcinoma, then
referral to Urology should be considered. (Reference: Cengu, et
al.: A 5-Year Follow up Study of Asymptomatic Men with Testicular
Microlithiasis. J Urol 4114; [id number].).

Critical Value/emergent results were called by telephone at the time
of interpretation on 11/16/2019 at [DATE] to provider GJ
ELIZ , who verbally acknowledged these results.
